# Patient Record
Sex: Female | Born: 1985 | Hispanic: Refuse to answer | Marital: Married | Smoking: Never smoker
Health system: Southern US, Community
[De-identification: ages and names within clinical notes are randomized; demographics above are authoritative.]

## PROBLEM LIST (undated history)

## (undated) DIAGNOSIS — G43909 Migraine, unspecified, not intractable, without status migrainosus: Secondary | ICD-10-CM

## (undated) DIAGNOSIS — M6208 Separation of muscle (nontraumatic), other site: Secondary | ICD-10-CM

## (undated) DIAGNOSIS — D509 Iron deficiency anemia, unspecified: Secondary | ICD-10-CM

## (undated) HISTORY — DX: Migraine, unspecified, not intractable, without status migrainosus: G43.909

## (undated) HISTORY — PX: WISDOM TOOTH EXTRACTION: SHX21

## (undated) HISTORY — PX: TONSILLECTOMY: SHX5217

---

## 2014-06-13 DIAGNOSIS — IMO0002 Reserved for concepts with insufficient information to code with codable children: Secondary | ICD-10-CM

## 2015-07-26 ENCOUNTER — Other Ambulatory Visit (INDEPENDENT_AMBULATORY_CARE_PROVIDER_SITE_OTHER): Payer: BLUE CROSS/BLUE SHIELD

## 2015-07-26 ENCOUNTER — Encounter: Payer: Self-pay | Admitting: Obstetrics & Gynecology

## 2015-07-26 DIAGNOSIS — N912 Amenorrhea, unspecified: Secondary | ICD-10-CM | POA: Diagnosis not present

## 2015-07-26 DIAGNOSIS — Z3201 Encounter for pregnancy test, result positive: Secondary | ICD-10-CM

## 2015-07-26 LAB — POCT PREGNANCY, URINE: PREG TEST UR: POSITIVE — AB

## 2015-07-26 NOTE — Progress Notes (Signed)
Patient had positive pregnancy test today in office. First day of last menstrual period was July 5th, 2016, which makes her [redacted] weeks pregnant. Patient will be going to [facility] for prenatal care.

## 2015-08-06 ENCOUNTER — Encounter: Payer: Self-pay | Admitting: Advanced Practice Midwife

## 2015-08-06 ENCOUNTER — Ambulatory Visit (INDEPENDENT_AMBULATORY_CARE_PROVIDER_SITE_OTHER): Payer: BLUE CROSS/BLUE SHIELD | Admitting: Advanced Practice Midwife

## 2015-08-06 ENCOUNTER — Other Ambulatory Visit: Payer: Self-pay | Admitting: Advanced Practice Midwife

## 2015-08-06 VITALS — BP 105/63 | HR 72 | Ht 62.5 in | Wt 149.0 lb

## 2015-08-06 DIAGNOSIS — Z3401 Encounter for supervision of normal first pregnancy, first trimester: Secondary | ICD-10-CM | POA: Diagnosis not present

## 2015-08-06 DIAGNOSIS — Z3490 Encounter for supervision of normal pregnancy, unspecified, unspecified trimester: Secondary | ICD-10-CM | POA: Insufficient documentation

## 2015-08-06 DIAGNOSIS — Z3491 Encounter for supervision of normal pregnancy, unspecified, first trimester: Secondary | ICD-10-CM

## 2015-08-06 NOTE — Patient Instructions (Signed)
First Trimester of Pregnancy The first trimester of pregnancy is from week 1 until the end of week 12 (months 1 through 3). A week after a sperm fertilizes an egg, the egg will implant on the wall of the uterus. This embryo will begin to develop into a baby. Genes from you and your partner are forming the baby. The female genes determine whether the baby is a boy or a girl. At 6-8 weeks, the eyes and face are formed, and the heartbeat can be seen on ultrasound. At the end of 12 weeks, all the baby's organs are formed.  Now that you are pregnant, you will want to do everything you can to have a healthy baby. Two of the most important things are to get good prenatal care and to follow your health care provider's instructions. Prenatal care is all the medical care you receive before the baby's birth. This care will help prevent, find, and treat any problems during the pregnancy and childbirth. BODY CHANGES Your body goes through many changes during pregnancy. The changes vary from woman to woman.   You may gain or lose a couple of pounds at first.  You may feel sick to your stomach (nauseous) and throw up (vomit). If the vomiting is uncontrollable, call your health care provider.  You may tire easily.  You may develop headaches that can be relieved by medicines approved by your health care provider.  You may urinate more often. Painful urination may mean you have a bladder infection.  You may develop heartburn as a result of your pregnancy.  You may develop constipation because certain hormones are causing the muscles that push waste through your intestines to slow down.  You may develop hemorrhoids or swollen, bulging veins (varicose veins).  Your breasts may begin to grow larger and become tender. Your nipples may stick out more, and the tissue that surrounds them (areola) may become darker.  Your gums may bleed and may be sensitive to brushing and flossing.  Dark spots or blotches (chloasma,  mask of pregnancy) may develop on your face. This will likely fade after the baby is born.  Your menstrual periods will stop.  You may have a loss of appetite.  You may develop cravings for certain kinds of food.  You may have changes in your emotions from day to day, such as being excited to be pregnant or being concerned that something may go wrong with the pregnancy and baby.  You may have more vivid and strange dreams.  You may have changes in your hair. These can include thickening of your hair, rapid growth, and changes in texture. Some women also have hair loss during or after pregnancy, or hair that feels dry or thin. Your hair will most likely return to normal after your baby is born. WHAT TO EXPECT AT YOUR PRENATAL VISITS During a routine prenatal visit:  You will be weighed to make sure you and the baby are growing normally.  Your blood pressure will be taken.  Your abdomen will be measured to track your baby's growth.  The fetal heartbeat will be listened to starting around week 10 or 12 of your pregnancy.  Test results from any previous visits will be discussed. Your health care provider may ask you:  How you are feeling.  If you are feeling the baby move.  If you have had any abnormal symptoms, such as leaking fluid, bleeding, severe headaches, or abdominal cramping.  If you have any questions. Other tests   that may be performed during your first trimester include:  Blood tests to find your blood type and to check for the presence of any previous infections. They will also be used to check for low iron levels (anemia) and Rh antibodies. Later in the pregnancy, blood tests for diabetes will be done along with other tests if problems develop.  Urine tests to check for infections, diabetes, or protein in the urine.  An ultrasound to confirm the proper growth and development of the baby.  An amniocentesis to check for possible genetic problems.  Fetal screens for  spina bifida and Down syndrome.  You may need other tests to make sure you and the baby are doing well. HOME CARE INSTRUCTIONS  Medicines  Follow your health care provider's instructions regarding medicine use. Specific medicines may be either safe or unsafe to take during pregnancy.  Take your prenatal vitamins as directed.  If you develop constipation, try taking a stool softener if your health care provider approves. Diet  Eat regular, well-balanced meals. Choose a variety of foods, such as meat or vegetable-based protein, fish, milk and low-fat dairy products, vegetables, fruits, and whole grain breads and cereals. Your health care provider will help you determine the amount of weight gain that is right for you.  Avoid raw meat and uncooked cheese. These carry germs that can cause birth defects in the baby.  Eating four or five small meals rather than three large meals a day may help relieve nausea and vomiting. If you start to feel nauseous, eating a few soda crackers can be helpful. Drinking liquids between meals instead of during meals also seems to help nausea and vomiting.  If you develop constipation, eat more high-fiber foods, such as fresh vegetables or fruit and whole grains. Drink enough fluids to keep your urine clear or pale yellow. Activity and Exercise  Exercise only as directed by your health care provider. Exercising will help you:  Control your weight.  Stay in shape.  Be prepared for labor and delivery.  Experiencing pain or cramping in the lower abdomen or low back is a good sign that you should stop exercising. Check with your health care provider before continuing normal exercises.  Try to avoid standing for long periods of time. Move your legs often if you must stand in one place for a long time.  Avoid heavy lifting.  Wear low-heeled shoes, and practice good posture.  You may continue to have sex unless your health care provider directs you  otherwise. Relief of Pain or Discomfort  Wear a good support bra for breast tenderness.   Take warm sitz baths to soothe any pain or discomfort caused by hemorrhoids. Use hemorrhoid cream if your health care provider approves.   Rest with your legs elevated if you have leg cramps or low back pain.  If you develop varicose veins in your legs, wear support hose. Elevate your feet for 15 minutes, 3-4 times a day. Limit salt in your diet. Prenatal Care  Schedule your prenatal visits by the twelfth week of pregnancy. They are usually scheduled monthly at first, then more often in the last 2 months before delivery.  Write down your questions. Take them to your prenatal visits.  Keep all your prenatal visits as directed by your health care provider. Safety  Wear your seat belt at all times when driving.  Make a list of emergency phone numbers, including numbers for family, friends, the hospital, and police and fire departments. General Tips    Ask your health care provider for a referral to a local prenatal education class. Begin classes no later than at the beginning of month 6 of your pregnancy.  Ask for help if you have counseling or nutritional needs during pregnancy. Your health care provider can offer advice or refer you to specialists for help with various needs.  Do not use hot tubs, steam rooms, or saunas.  Do not douche or use tampons or scented sanitary pads.  Do not cross your legs for long periods of time.  Avoid cat litter boxes and soil used by cats. These carry germs that can cause birth defects in the baby and possibly loss of the fetus by miscarriage or stillbirth.  Avoid all smoking, herbs, alcohol, and medicines not prescribed by your health care provider. Chemicals in these affect the formation and growth of the baby.  Schedule a dentist appointment. At home, brush your teeth with a soft toothbrush and be gentle when you floss. SEEK MEDICAL CARE IF:   You have  dizziness.  You have mild pelvic cramps, pelvic pressure, or nagging pain in the abdominal area.  You have persistent nausea, vomiting, or diarrhea.  You have a bad smelling vaginal discharge.  You have pain with urination.  You notice increased swelling in your face, hands, legs, or ankles. SEEK IMMEDIATE MEDICAL CARE IF:   You have a fever.  You are leaking fluid from your vagina.  You have spotting or bleeding from your vagina.  You have severe abdominal cramping or pain.  You have rapid weight gain or loss.  You vomit blood or material that looks like coffee grounds.  You are exposed to German measles and have never had them.  You are exposed to fifth disease or chickenpox.  You develop a severe headache.  You have shortness of breath.  You have any kind of trauma, such as from a fall or a car accident. Document Released: 11/18/2001 Document Revised: 04/10/2014 Document Reviewed: 10/04/2013 ExitCare Patient Information 2015 [employer]. This information is not intended to replace advice given to you by your health care provider. Make sure you discuss any questions you have with your health care provider.  

## 2015-08-06 NOTE — Progress Notes (Signed)
Pt here for initial prenatal visit. Last Pap 2015 - normal at [facility] - Previous pregnancy at [facility] and all records/info are on [facility].  Bedside US shows single IUP with FHR 137 and CRL [redacted]w[redacted]d which is only 4 days off from dating by LMP ([redacted]w[redacted]d)

## 2015-08-06 NOTE — Progress Notes (Signed)
   Subjective:    Kathy Nichols is a G2P1001 [redacted]w[redacted]d being seen today for her first obstetrical visit.  Her obstetrical history is significant for NSVD x1 at term. Patient does intend to breast feed.  She is currently breastfeeding her 39 month old. Pregnancy history fully reviewed.  Patient reports fatigue and nausea.  She does not desire medication at this time and is managing well.   Filed Vitals:   08/06/15 1006 08/06/15 1007  BP: 105/63   Pulse: 72   Height:  5' 2.5" (1.588 m)  Weight: 149 lb (67.586 kg)     HISTORY: OB History  Gravida Para Term Preterm AB SAB TAB Ectopic Multiple Living  # Outcome Date GA Lbr Len/2nd Weight Sex Delivery Anes PTL Lv  2 Current           1 Term 06/13/14 [redacted]w[redacted]d  5 lb 12 oz (2.608 kg) M Vag-Spont EPI N Y     Complications: Laceration     Past Medical History  Diagnosis Date  . Migraine    Past Surgical History  Procedure Laterality Date  . Tonsillectomy    . Wisdom tooth extraction     Family History  Problem Relation Age of Onset  . Diabetes Maternal Grandmother   . Diabetes Paternal Grandmother   . Thyroid disease Mother   . Thyroid disease Brother   . Hypertension Father   . Glaucoma Paternal Grandfather   . Hyperlipidemia Father      Exam    Uterus:     Pelvic Exam: Exam deferred, Pap in 2015 was normal  System: Breast:  normal appearance, no masses or tenderness   Skin: normal coloration and turgor, no rashes    Neurologic: oriented, normal, gait normal; reflexes normal and symmetric   Extremities: normal strength, tone, and muscle mass, ROM of all joints is normal   HEENT neck supple with midline trachea   Mouth/Teeth mucous membranes moist, pharynx normal without lesions and dental hygiene good   Neck supple and no masses   Cardiovascular:    Respiratory:  appears well, vitals normal, no respiratory distress, acyanotic, normal RR, ear and throat exam is normal, neck free of mass or lymphadenopathy   Abdomen: soft, non-tender; bowel sounds normal; no masses,  no organomegaly   Urinary: not evaluated      Assessment:    Pregnancy: G2P1001 Patient Active Problem List   Diagnosis Date Noted  . Normal pregnancy 08/06/2015        Plan:     Initial labs drawn. Prenatal vitamins. Problem list reviewed and updated. Genetic Screening discussed First Screen: ordered.  Ultrasound discussed; fetal survey: requested.  Follow up according to [facility] schedule    LEFTWICH-KIRBY, Kathy Nichols 08/06/2015

## 2015-08-07 LAB — PRENATAL PROFILE (SOLSTAS)
Antibody Screen: NEGATIVE
BASOS PCT: 0 % (ref 0–1)
Basophils Absolute: 0 10*3/uL (ref 0.0–0.1)
Eosinophils Absolute: 0 10*3/uL (ref 0.0–0.7)
Eosinophils Relative: 0 % (ref 0–5)
HCT: 40.7 % (ref 36.0–46.0)
HEMOGLOBIN: 12.7 g/dL (ref 12.0–15.0)
HEP B S AG: NEGATIVE
HIV: NONREACTIVE
LYMPHS ABS: 1.4 10*3/uL (ref 0.7–4.0)
LYMPHS PCT: 20 % (ref 12–46)
MCH: 25.5 pg — AB (ref 26.0–34.0)
MCHC: 31.2 g/dL (ref 30.0–36.0)
MCV: 81.6 fL (ref 78.0–100.0)
MONO ABS: 0.5 10*3/uL (ref 0.1–1.0)
MONOS PCT: 7 % (ref 3–12)
MPV: 10 fL (ref 8.6–12.4)
NEUTROS ABS: 5 10*3/uL (ref 1.7–7.7)
NEUTROS PCT: 73 % (ref 43–77)
Platelets: 227 10*3/uL (ref 150–400)
RBC: 4.99 MIL/uL (ref 3.87–5.11)
RDW: 15.9 % — AB (ref 11.5–15.5)
RUBELLA: 4.01 {index} — AB (ref <0.90)
Rh Type: POSITIVE
WBC: 6.9 10*3/uL (ref 4.0–10.5)

## 2015-08-07 LAB — GC/CHLAMYDIA PROBE AMP
CT PROBE, AMP APTIMA: NEGATIVE
GC PROBE AMP APTIMA: NEGATIVE

## 2015-08-09 LAB — CULTURE, OB URINE: Colony Count: 1000

## 2015-08-10 ENCOUNTER — Encounter: Payer: Self-pay | Admitting: Advanced Practice Midwife

## 2015-08-10 DIAGNOSIS — O2340 Unspecified infection of urinary tract in pregnancy, unspecified trimester: Secondary | ICD-10-CM

## 2015-08-10 DIAGNOSIS — B951 Streptococcus, group B, as the cause of diseases classified elsewhere: Secondary | ICD-10-CM | POA: Insufficient documentation

## 2015-09-04 ENCOUNTER — Encounter: Payer: BLUE CROSS/BLUE SHIELD | Admitting: Obstetrics & Gynecology

## 2015-09-05 ENCOUNTER — Other Ambulatory Visit: Payer: Self-pay | Admitting: Advanced Practice Midwife

## 2015-09-05 DIAGNOSIS — Z369 Encounter for antenatal screening, unspecified: Secondary | ICD-10-CM

## 2015-09-05 DIAGNOSIS — Z3A12 12 weeks gestation of pregnancy: Secondary | ICD-10-CM

## 2015-09-05 DIAGNOSIS — Z3491 Encounter for supervision of normal pregnancy, unspecified, first trimester: Secondary | ICD-10-CM

## 2015-09-06 ENCOUNTER — Encounter (HOSPITAL_COMMUNITY): Payer: Self-pay

## 2015-09-06 ENCOUNTER — Ambulatory Visit (HOSPITAL_COMMUNITY)
Admission: RE | Admit: 2015-09-06 | Discharge: 2015-09-06 | Disposition: A | Discharge status: Home / Self Care | Payer: BLUE CROSS/BLUE SHIELD | Source: Ambulatory Visit | Attending: Advanced Practice Midwife | Admitting: Advanced Practice Midwife

## 2015-09-06 ENCOUNTER — Other Ambulatory Visit: Payer: Self-pay | Admitting: Advanced Practice Midwife

## 2015-09-06 ENCOUNTER — Telehealth: Payer: Self-pay | Admitting: *Deleted

## 2015-09-06 DIAGNOSIS — Z369 Encounter for antenatal screening, unspecified: Secondary | ICD-10-CM

## 2015-09-06 DIAGNOSIS — Z3A12 12 weeks gestation of pregnancy: Secondary | ICD-10-CM

## 2015-09-06 DIAGNOSIS — Z36 Encounter for antenatal screening of mother: Secondary | ICD-10-CM | POA: Insufficient documentation

## 2015-09-06 DIAGNOSIS — Z3491 Encounter for supervision of normal pregnancy, unspecified, first trimester: Secondary | ICD-10-CM

## 2015-09-06 NOTE — Telephone Encounter (Signed)
Called pt to follow up on 12w OB visit - [facility] schedule. LMOM for pt to rtn call.

## 2015-09-07 ENCOUNTER — Ambulatory Visit (INDEPENDENT_AMBULATORY_CARE_PROVIDER_SITE_OTHER): Payer: BLUE CROSS/BLUE SHIELD | Admitting: Family

## 2015-09-07 ENCOUNTER — Encounter: Payer: BLUE CROSS/BLUE SHIELD | Admitting: Family

## 2015-09-07 VITALS — BP 83/52 | HR 74 | Wt 144.0 lb

## 2015-09-07 DIAGNOSIS — Z3490 Encounter for supervision of normal pregnancy, unspecified, unspecified trimester: Secondary | ICD-10-CM

## 2015-09-07 DIAGNOSIS — Z3481 Encounter for supervision of other normal pregnancy, first trimester: Secondary | ICD-10-CM

## 2015-09-07 NOTE — Progress Notes (Signed)
Subjective:  Kathy Nichols is a 29 y.o. G2P1001 at [redacted]w[redacted]d being seen today for ongoing prenatal care.  Patient reports fatigue.  Contractions: Not present.  Vag. Bleeding: None. Movement: Absent.   The following portions of the patient's history were reviewed and updated as appropriate: allergies, current medications, past family history, past medical history, past social history, past surgical history and problem list.   Objective:   Filed Vitals:   09/07/15 0958  BP: 83/52  Pulse: 74  Weight: 144 lb (65.318 kg)    Fetal Status:     Movement: Absent     General:  Alert, oriented and cooperative. Patient is in no acute distress.  Skin: Skin is warm and dry. No rash noted.   Cardiovascular: Normal heart rate noted  Respiratory: Normal respiratory effort, no problems with respiration noted  Abdomen: Soft, gravid, appropriate for gestational age. Pain/Pressure: Present     Pelvic: Vag. Bleeding: None Vag D/C Character: Thin   Cervical exam deferred        Extremities: Normal range of motion.  Edema: None  Mental Status: Normal mood and affect. Normal behavior. Normal judgment and thought content.   Urinalysis:    Protein Negative Glucose negative  Assessment and Plan:  Pregnancy: G2P1001 at [redacted]w[redacted]d  1. Normal pregnancy, unspecified trimester - Explained fatigue in first trimester normal; encouraged rest - Reviewed prenatal labs and NT ultrasound - Diet recommendation > avocado, sweet potato (desire low smelling food) - Anatomy ultrasound ordered for 18-19 weeks ([facility])   General obstetric precautions including but not limited to vaginal bleeding and pelvic pain reviewed in detail with the patient.  Please refer to After Visit Summary for other counseling recommendations.   Follow-up at 20 wks  Marlis Edelson, CNM

## 2015-09-11 ENCOUNTER — Encounter: Payer: Self-pay | Admitting: *Deleted

## 2015-09-11 ENCOUNTER — Other Ambulatory Visit (HOSPITAL_COMMUNITY): Payer: Self-pay | Admitting: Advanced Practice Midwife

## 2015-09-11 DIAGNOSIS — Z3490 Encounter for supervision of normal pregnancy, unspecified, unspecified trimester: Secondary | ICD-10-CM

## 2015-10-26 ENCOUNTER — Ambulatory Visit (HOSPITAL_COMMUNITY)
Admission: RE | Admit: 2015-10-26 | Discharge: 2015-10-26 | Disposition: A | Discharge status: Home / Self Care | Payer: BLUE CROSS/BLUE SHIELD | Source: Ambulatory Visit | Attending: Family | Admitting: Family

## 2015-10-26 ENCOUNTER — Other Ambulatory Visit: Payer: Self-pay | Admitting: Family

## 2015-10-26 DIAGNOSIS — Z3490 Encounter for supervision of normal pregnancy, unspecified, unspecified trimester: Secondary | ICD-10-CM

## 2015-10-26 DIAGNOSIS — Z3689 Encounter for other specified antenatal screening: Secondary | ICD-10-CM

## 2015-10-26 DIAGNOSIS — Z3A19 19 weeks gestation of pregnancy: Secondary | ICD-10-CM

## 2015-10-26 DIAGNOSIS — Z36 Encounter for antenatal screening of mother: Secondary | ICD-10-CM | POA: Insufficient documentation

## 2015-11-05 ENCOUNTER — Ambulatory Visit (INDEPENDENT_AMBULATORY_CARE_PROVIDER_SITE_OTHER): Payer: BLUE CROSS/BLUE SHIELD | Admitting: Advanced Practice Midwife

## 2015-11-05 VITALS — BP 103/61 | HR 81 | Wt 156.0 lb

## 2015-11-05 DIAGNOSIS — Z36 Encounter for antenatal screening of mother: Secondary | ICD-10-CM

## 2015-11-05 DIAGNOSIS — Z3492 Encounter for supervision of normal pregnancy, unspecified, second trimester: Secondary | ICD-10-CM

## 2015-11-05 DIAGNOSIS — Z3482 Encounter for supervision of other normal pregnancy, second trimester: Secondary | ICD-10-CM

## 2015-11-05 NOTE — Patient Instructions (Signed)
Tdap Vaccine (Tetanus, Diphtheria and Pertussis): What You Need to Know 1. Why get vaccinated? Tetanus, diphtheria and pertussis are very serious diseases. Tdap vaccine can protect us from these diseases. And, Tdap vaccine given to pregnant women can protect newborn babies against pertussis. TETANUS (Lockjaw) is rare in the [place] today. It causes painful muscle tightening and stiffness, usually all over the body.  It can lead to tightening of muscles in the head and neck so you can't open your mouth, swallow, or sometimes even breathe. Tetanus kills about 1 out of 10 people who are infected even after receiving the best medical care. DIPHTHERIA is also rare in the [place] today. It can cause a thick coating to form in the back of the throat.  It can lead to breathing problems, heart failure, paralysis, and death. PERTUSSIS (Whooping Cough) causes severe coughing spells, which can cause difficulty breathing, vomiting and disturbed sleep.  It can also lead to weight loss, incontinence, and rib fractures. Up to 2 in 100 adolescents and 5 in 100 adults with pertussis are hospitalized or have complications, which could include pneumonia or death. These diseases are caused by bacteria. Diphtheria and pertussis are spread from person to person through secretions from coughing or sneezing. Tetanus enters the body through cuts, scratches, or wounds. Before vaccines, as many as 200,000 cases of diphtheria, 200,000 cases of pertussis, and hundreds of cases of tetanus, were reported in the [place] each year. Since vaccination began, reports of cases for tetanus and diphtheria have dropped by about 99% and for pertussis by about 80%. 2. Tdap vaccine Tdap vaccine can protect adolescents and adults from tetanus, diphtheria, and pertussis. One dose of Tdap is routinely given at age 11 or 12. People who did not get Tdap at that age should get it as soon as possible. Tdap is especially important  for healthcare professionals and anyone having close contact with a baby younger than 12 months. Pregnant women should get a dose of Tdap during every pregnancy, to protect the newborn from pertussis. Infants are most at risk for severe, life-threatening complications from pertussis. Another vaccine, called Td, protects against tetanus and diphtheria, but not pertussis. A Td booster should be given every 10 years. Tdap may be given as one of these boosters if you have never gotten Tdap before. Tdap may also be given after a severe cut or burn to prevent tetanus infection. Your doctor or the person giving you the vaccine can give you more information. Tdap may safely be given at the same time as other vaccines. 3. Some people should not get this vaccine  A person who has ever had a life-threatening allergic reaction after a previous dose of any diphtheria, tetanus or pertussis containing vaccine, OR has a severe allergy to any part of this vaccine, should not get Tdap vaccine. Tell the person giving the vaccine about any severe allergies.  Anyone who had coma or long repeated seizures within 7 days after a childhood dose of DTP or DTaP, or a previous dose of Tdap, should not get Tdap, unless a cause other than the vaccine was found. They can still get Td.  Talk to your doctor if you:  have seizures or another nervous system problem,  had severe pain or swelling after any vaccine containing diphtheria, tetanus or pertussis,  ever had a condition called Guillain-Barr Syndrome (GBS),  aren't feeling well on the day the shot is scheduled. 4. Risks With any medicine, including vaccines, there is   a chance of side effects. These are usually mild and go away on their own. Serious reactions are also possible but are rare. Most people who get Tdap vaccine do not have any problems with it. Mild problems following Tdap (Did not interfere with activities)  Pain where the shot was given (about 3 in 4  adolescents or 2 in 3 adults)  Redness or swelling where the shot was given (about 1 person in 5)  Mild fever of at least 100.4F (up to about 1 in 25 adolescents or 1 in 100 adults)  Headache (about 3 or 4 people in 10)  Tiredness (about 1 person in 3 or 4)  Nausea, vomiting, diarrhea, stomach ache (up to 1 in 4 adolescents or 1 in 10 adults)  Chills, sore joints (about 1 person in 10)  Body aches (about 1 person in 3 or 4)  Rash, swollen glands (uncommon) Moderate problems following Tdap (Interfered with activities, but did not require medical attention)  Pain where the shot was given (up to 1 in 5 or 6)  Redness or swelling where the shot was given (up to about 1 in 16 adolescents or 1 in 12 adults)  Fever over 102F (about 1 in 100 adolescents or 1 in 250 adults)  Headache (about 1 in 7 adolescents or 1 in 10 adults)  Nausea, vomiting, diarrhea, stomach ache (up to 1 or 3 people in 100)  Swelling of the entire arm where the shot was given (up to about 1 in 500). Severe problems following Tdap (Unable to perform usual activities; required medical attention)  Swelling, severe pain, bleeding and redness in the arm where the shot was given (rare). Problems that could happen after any vaccine:  People sometimes faint after a medical procedure, including vaccination. Sitting or lying down for about 15 minutes can help prevent fainting, and injuries caused by a fall. Tell your doctor if you feel dizzy, or have vision changes or ringing in the ears.  Some people get severe pain in the shoulder and have difficulty moving the arm where a shot was given. This happens very rarely.  Any medication can cause a severe allergic reaction. Such reactions from a vaccine are very rare, estimated at fewer than 1 in a million doses, and would happen within a few minutes to a few hours after the vaccination. As with any medicine, there is a very remote chance of a vaccine causing a serious  injury or death. The safety of vaccines is always being monitored. For more information, visit: www.cdc.gov/vaccinesafety/ 5. What if there is a serious problem? What should I look for?  Look for anything that concerns you, such as signs of a severe allergic reaction, very high fever, or unusual behavior.  Signs of a severe allergic reaction can include hives, swelling of the face and throat, difficulty breathing, a fast heartbeat, dizziness, and weakness. These would usually start a few minutes to a few hours after the vaccination. What should I do?  If you think it is a severe allergic reaction or other emergency that can't wait, call 9-1-1 or get the person to the nearest hospital. Otherwise, call your doctor.  Afterward, the reaction should be reported to the Vaccine Adverse Event Reporting System (VAERS). Your doctor might file this report, or you can do it yourself through the VAERS web site at www.vaers.hhs.gov, or by calling 1-800-822-7967. VAERS does not give medical advice.  6. The National Vaccine Injury Compensation Program The National Vaccine Injury Compensation Program (  VICP) is a federal program that was created to compensate people who may have been injured by certain vaccines. Persons who believe they may have been injured by a vaccine can learn about the program and about filing a claim by calling 1-800-338-2382 or visiting the VICP website at www.hrsa.gov/vaccinecompensation. There is a time limit to file a claim for compensation. 7. How can I learn more?  Ask your doctor. He or she can give you the vaccine package insert or suggest other sources of information.  Call your local or state health department.  Contact the Centers for Disease Control and Prevention (CDC):  Call 1-800-232-4636 (1-800-CDC-INFO) or  Visit CDC's website at www.cdc.gov/vaccines CDC Tdap Vaccine VIS (01/31/14)   This information is not intended to replace advice given to you by your health care  provider. Make sure you discuss any questions you have with your health care provider.   Document Released: 05/25/2012 Document Revised: 12/15/2014 Document Reviewed: 03/08/2014 Elsevier Interactive Patient Education 2016 Elsevier Inc.  

## 2015-11-05 NOTE — Progress Notes (Signed)
Subjective:  Kathy Nichols is a 29 y.o. G2P1001 at 3854w6d being seen today for ongoing prenatal care.  She is currently monitored for the following issues for this low-risk pregnancy and has Normal pregnancy and GBS (group B streptococcus) UTI complicating pregnancy on her problem list.   [place]   Patient reports dysuria.  Contractions: Not present. Vag. Bleeding: None.  Movement: Present. Denies leaking of fluid.   The following portions of the patient's history were reviewed and updated as appropriate: allergies, current medications, past family history, past medical history, past social history, past surgical history and problem list. Problem list updated.  Objective:   Filed Vitals:   11/05/15 0940  BP: 103/61  Pulse: 81  Weight: 156 lb (70.761 kg)    Fetal Status: Fetal Heart Rate (bpm): 153   Movement: Present     General:  Alert, oriented and cooperative. Patient is in no acute distress.  Skin: Skin is warm and dry. No rash noted.   Cardiovascular: Normal heart rate noted  Respiratory: Normal respiratory effort, no problems with respiration noted  Abdomen: Soft, gravid, appropriate for gestational age. Pain/Pressure: Present     Pelvic: Vag. Bleeding: None Vag D/C Character: White   Cervical exam deferred        Extremities: Normal range of motion.  Edema: None  Mental Status: Normal mood and affect. Normal behavior. Normal judgment and thought content.   Urinalysis:      Reviewed normal US.   Assessment and Plan:  Pregnancy: G2P1001 at 4954w6d  1. Normal pregnancy, second trimester  - Alpha fetoprotein, maternal - Culture, OB Urine  Preterm labor symptoms and general obstetric precautions including but not limited to vaginal bleeding, contractions, leaking of fluid and fetal movement were reviewed in detail with the patient. Please refer to After Visit Summary for other counseling recommendations.  Travel precautions. Copy of PNR.   Return in about 8 weeks (around 12/31/2015) for Return OB.   Dorathy KinsmanVirginia Siena Poehler, CNM

## 2015-11-07 LAB — CULTURE, OB URINE: Colony Count: >=100000

## 2015-11-08 ENCOUNTER — Other Ambulatory Visit: Payer: Self-pay | Admitting: Advanced Practice Midwife

## 2015-11-08 DIAGNOSIS — O2342 Unspecified infection of urinary tract in pregnancy, second trimester: Principal | ICD-10-CM

## 2015-11-08 DIAGNOSIS — B951 Streptococcus, group B, as the cause of diseases classified elsewhere: Secondary | ICD-10-CM

## 2015-11-08 LAB — ALPHA FETOPROTEIN, MATERNAL
AFP: 50.4 ng/mL
Curr Gest Age: 20.6 wks.days
MOM FOR AFP: 0.79
Open Spina bifida: NEGATIVE

## 2015-11-08 MED ORDER — AMOXICILLIN 500 MG PO CAPS: 500.0000 mg | ORAL_CAPSULE | Freq: Three times a day (TID) | ORAL | Status: DC

## 2015-11-08 NOTE — Progress Notes (Signed)
GBS UTI. Rx Amox.

## 2015-11-09 ENCOUNTER — Telehealth: Payer: Self-pay | Admitting: *Deleted

## 2015-11-09 NOTE — Telephone Encounter (Signed)
-----   Message from [place], [place] sent at 11/08/2015  8:01 PM EST ----- GBS UTI. I will send in Rx.

## 2015-11-09 NOTE — Telephone Encounter (Signed)
Pt notified of GBS in urine and RX was sent to her pharmacy.

## 2015-12-09 NOTE — L&D Delivery Note (Signed)
Patient is 30 y.o. G2P1001 6871w0d admitted in active labor, uncomplicated pregnancy.   Delivery Note At 9:24 PM a viable female was delivered via Vaginal, Spontaneous Delivery (Presentation: Left Occiput Anterior).  APGAR: 8, 9; weight- pending .   Placenta status: Intact, Spontaneous.  Cord: 3 vessels with the following complications: None. Delayed cord clamping.  Cord pH: not collected  Anesthesia: None  Episiotomy: None Lacerations:  2.0 degree Suture Repair: 3.0 Est. Blood Loss (mL):  150  Mom to postpartum.  Baby to Couplet care / Skin to Skin.  Isa RankinKimberly Niles [facility] 03/11/2016, 9:57 PM

## 2015-12-31 ENCOUNTER — Ambulatory Visit (INDEPENDENT_AMBULATORY_CARE_PROVIDER_SITE_OTHER): Payer: BLUE CROSS/BLUE SHIELD | Admitting: Advanced Practice Midwife

## 2015-12-31 ENCOUNTER — Encounter: Payer: Self-pay | Admitting: Advanced Practice Midwife

## 2015-12-31 VITALS — BP 100/59 | HR 81 | Wt 167.0 lb

## 2015-12-31 DIAGNOSIS — Z23 Encounter for immunization: Secondary | ICD-10-CM | POA: Diagnosis not present

## 2015-12-31 DIAGNOSIS — Z3483 Encounter for supervision of other normal pregnancy, third trimester: Secondary | ICD-10-CM

## 2015-12-31 DIAGNOSIS — Z36 Encounter for antenatal screening of mother: Secondary | ICD-10-CM

## 2015-12-31 DIAGNOSIS — Z3493 Encounter for supervision of normal pregnancy, unspecified, third trimester: Secondary | ICD-10-CM

## 2015-12-31 NOTE — Progress Notes (Signed)
Subjective:  Kathy Nichols is a 30 y.o. G2P1001 at [redacted]w[redacted]d being seen today for ongoing prenatal care.  She is currently monitored for the following issues for this low-risk pregnancy and has Normal pregnancy and GBS (group B streptococcus) UTI complicating pregnancy on her problem list.  Patient reports backache and sleepign issues.   Discussed sleep hygiene and blue filter for phone (plays games at bedtime).  Discussed back care   Contractions: Irregular. Vag. Bleeding: None.  Movement: Present. Denies leaking of fluid.   The following portions of the patient's history were reviewed and updated as appropriate: allergies, current medications, past family history, past medical history, past social history, past surgical history and problem list. Problem list updated.  Objective:   Filed Vitals:   12/31/15 0935  BP: 100/59  Pulse: 81  Weight: 75.751 kg (167 lb)    Fetal Status: Fetal Heart Rate (bpm): 143   Movement: Present     General:  Alert, oriented and cooperative. Patient is in no acute distress.  Skin: Skin is warm and dry. No rash noted.   Cardiovascular: Normal heart rate noted  Respiratory: Normal respiratory effort, no problems with respiration noted  Abdomen: Soft, gravid, appropriate for gestational age. Pain/Pressure: Present     Pelvic: Vag. Bleeding: None Vag D/C Character: Thin   Cervical exam deferred        Extremities: Normal range of motion.  Edema: None  Mental Status: Normal mood and affect. Normal behavior. Normal judgment and thought content.   Urinalysis: Urine Protein: Negative Urine Glucose: Negative  Assessment and Plan:  Pregnancy: G2P1001 at [redacted]w[redacted]d  1. Normal pregnancy, third trimester      Reviewed issue and multiple questions answered. Wants female providers if possible and no students if possible. - CBC - HIV antibody - RPR - Glucose Tolerance, 1 HR (50g) - Tdap vaccine greater than or equal to 7yo IM - WET PREP BY MOLECULAR PROBE  Preterm  labor symptoms and general obstetric precautions including but not limited to vaginal bleeding, contractions, leaking of fluid and fetal movement were reviewed in detail with the patient. Please refer to After Visit Summary for other counseling recommendations.  RTO 2 weeks  Aviva Signs, CNM

## 2015-12-31 NOTE — Patient Instructions (Signed)

## 2016-01-01 ENCOUNTER — Telehealth: Payer: Self-pay | Admitting: *Deleted

## 2016-01-01 LAB — CBC
HEMATOCRIT: 33.2 % — AB (ref 36.0–46.0)
Hemoglobin: 10.5 g/dL — ABNORMAL LOW (ref 12.0–15.0)
MCH: 25.4 pg — ABNORMAL LOW (ref 26.0–34.0)
MCHC: 31.6 g/dL (ref 30.0–36.0)
MCV: 80.4 fL (ref 78.0–100.0)
MPV: 10.2 fL (ref 8.6–12.4)
PLATELETS: 191 10*3/uL (ref 150–400)
RBC: 4.13 MIL/uL (ref 3.87–5.11)
RDW: 15.8 % — AB (ref 11.5–15.5)
WBC: 7.4 10*3/uL (ref 4.0–10.5)

## 2016-01-01 LAB — WET PREP BY MOLECULAR PROBE
Candida species: NEGATIVE
GARDNERELLA VAGINALIS: NEGATIVE
Trichomonas vaginosis: NEGATIVE

## 2016-01-01 LAB — HIV ANTIBODY (ROUTINE TESTING W REFLEX): HIV: NONREACTIVE

## 2016-01-01 LAB — GLUCOSE TOLERANCE, 1 HOUR (50G) W/O FASTING: GLUCOSE 1 HOUR GTT: 81 mg/dL (ref 70–140)

## 2016-01-01 LAB — RPR

## 2016-01-01 NOTE — Telephone Encounter (Signed)
Pt notified of normal 1 hr GTT. 

## 2016-01-28 ENCOUNTER — Ambulatory Visit (INDEPENDENT_AMBULATORY_CARE_PROVIDER_SITE_OTHER): Payer: BLUE CROSS/BLUE SHIELD | Admitting: Advanced Practice Midwife

## 2016-01-28 VITALS — BP 110/64 | HR 81 | Wt 173.0 lb

## 2016-01-28 DIAGNOSIS — N898 Other specified noninflammatory disorders of vagina: Secondary | ICD-10-CM | POA: Diagnosis not present

## 2016-01-28 DIAGNOSIS — Z3483 Encounter for supervision of other normal pregnancy, third trimester: Secondary | ICD-10-CM

## 2016-01-28 DIAGNOSIS — Z3493 Encounter for supervision of normal pregnancy, unspecified, third trimester: Secondary | ICD-10-CM

## 2016-01-28 DIAGNOSIS — O26893 Other specified pregnancy related conditions, third trimester: Secondary | ICD-10-CM

## 2016-01-28 NOTE — Patient Instructions (Signed)

## 2016-01-28 NOTE — Progress Notes (Signed)
Subjective:  Kathy Nichols is a 30 y.o. G2P1001 at [redacted]w[redacted]d being seen today for ongoing prenatal care.  She is currently monitored for the following issues for this low-risk pregnancy and has Normal pregnancy and GBS (group B streptococcus) UTI complicating pregnancy on her problem list.  Patient reports occasional round ligament and back pain and vaginal discharge x 3 days.  Contractions: Irregular. Vag. Bleeding: None.  Movement: Present. Denies leaking of fluid.   The following portions of the patient's history were reviewed and updated as appropriate: allergies, current medications, past family history, past medical history, past social history, past surgical history and problem list. Problem list updated.  Objective:   Filed Vitals:   01/28/16 0936  BP: 110/64  Pulse: 81  Weight: 173 lb (78.472 kg)    Fetal Status:     Movement: Present  Presentation: Vertex  General:  Alert, oriented and cooperative. Patient is in no acute distress.  Skin: Skin is warm and dry. No rash noted.   Cardiovascular: Normal heart rate noted  Respiratory: Normal respiratory effort, no problems with respiration noted  Abdomen: Soft, gravid, appropriate for gestational age. Pain/Pressure: Present     Pelvic: Vag. Bleeding: None Vag D/C Character: Thin   Cervical exam performed Dilation: 1 Effacement (%): 50 Station: -3  Extremities: Normal range of motion.  Edema: Trace  Mental Status: Normal mood and affect. Normal behavior. Normal judgment and thought content.   Urinalysis: Urine Protein: Negative (Simultaneous filing. User may not have seen previous data.) Urine Glucose: Negative (Simultaneous filing. User may not have seen previous data.)  Assessment and Plan:  Pregnancy: G2P1001 at [redacted]w[redacted]d  1. Vaginal discharge during pregnancy in third trimester  - WET PREP BY MOLECULAR PROBE  2. Normal pregnancy, third trimester --Pt denies regular contractions, only reports back pain and inguinal pain with  movement.  Reviewed reasons to return to office or go to MAU.  Preterm labor symptoms and general obstetric precautions including but not limited to vaginal bleeding, contractions, leaking of fluid and fetal movement were reviewed in detail with the patient. Please refer to After Visit Summary for other counseling recommendations.  No Follow-up on file.   Hurshel Party, CNM

## 2016-01-28 NOTE — Progress Notes (Signed)
C/O lower back pain, mood swings, increased vaginal discharge. Pt would like exam to check discharge

## 2016-01-29 ENCOUNTER — Telehealth: Payer: Self-pay | Admitting: *Deleted

## 2016-01-29 DIAGNOSIS — N760 Acute vaginitis: Principal | ICD-10-CM

## 2016-01-29 DIAGNOSIS — B9689 Other specified bacterial agents as the cause of diseases classified elsewhere: Secondary | ICD-10-CM

## 2016-01-29 LAB — WET PREP BY MOLECULAR PROBE
Candida species: NEGATIVE
GARDNERELLA VAGINALIS: POSITIVE — AB
TRICHOMONAS VAG: NEGATIVE

## 2016-01-29 MED ORDER — METRONIDAZOLE 0.75 % VA GEL: VAGINAL | Status: DC

## 2016-01-29 NOTE — Telephone Encounter (Signed)
Pt notified of positive BV on wet prep.  She prefers Metrogel over Flagyl PO.  RX was sent to [facility] per L Leftwich-Kirby, CNM

## 2016-01-29 NOTE — Telephone Encounter (Signed)
-----   Message from Lisa A Leftwich-Kirby, CNM sent at 01/29/2016  8:19 AM EST ----- Pt positive for BV.  Call to offer her Flagyl 500 mg BID x 7 days or Metrogel Q HS x 5, her preference.  Thank you. 

## 2016-01-29 NOTE — Telephone Encounter (Signed)
-----   Message from Hurshel Party, CNM sent at 01/29/2016  8:19 AM EST ----- Pt positive for BV.  Call to offer her Flagyl 500 mg BID x 7 days or Metrogel Q HS x 5, her preference.  Thank you.

## 2016-02-25 ENCOUNTER — Ambulatory Visit (INDEPENDENT_AMBULATORY_CARE_PROVIDER_SITE_OTHER): Payer: BLUE CROSS/BLUE SHIELD | Admitting: Advanced Practice Midwife

## 2016-02-25 VITALS — BP 110/67 | HR 80 | Wt 181.0 lb

## 2016-02-25 DIAGNOSIS — R42 Dizziness and giddiness: Secondary | ICD-10-CM | POA: Diagnosis not present

## 2016-02-25 DIAGNOSIS — Z3483 Encounter for supervision of other normal pregnancy, third trimester: Secondary | ICD-10-CM

## 2016-02-25 DIAGNOSIS — Z36 Encounter for antenatal screening of mother: Secondary | ICD-10-CM | POA: Diagnosis not present

## 2016-02-25 DIAGNOSIS — Z3493 Encounter for supervision of normal pregnancy, unspecified, third trimester: Secondary | ICD-10-CM

## 2016-02-25 LAB — OB RESULTS CONSOLE GC/CHLAMYDIA
CHLAMYDIA, DNA PROBE: NEGATIVE
GC PROBE AMP, GENITAL: NEGATIVE

## 2016-02-25 NOTE — Patient Instructions (Signed)
Braxton Hicks Contractions °Contractions of the uterus can occur throughout pregnancy. Contractions are not always a sign that you are in labor.  °WHAT ARE BRAXTON HICKS CONTRACTIONS?  °Contractions that occur before labor are called Braxton Hicks contractions, or false labor. Toward the end of pregnancy (32-34 weeks), these contractions can develop more often and may become more forceful. This is not true labor because these contractions do not result in opening (dilatation) and thinning of the cervix. They are sometimes difficult to tell apart from true labor because these contractions can be forceful and people have different pain tolerances. You should not feel embarrassed if you go to the hospital with false labor. Sometimes, the only way to tell if you are in true labor is for your health care provider to look for changes in the cervix. °If there are no prenatal problems or other health problems associated with the pregnancy, it is completely safe to be sent home with false labor and await the onset of true labor. °HOW CAN YOU TELL THE DIFFERENCE BETWEEN TRUE AND FALSE LABOR? °False Labor °· The contractions of false labor are usually shorter and not as hard as those of true labor.   °· The contractions are usually irregular.   °· The contractions are often felt in the front of the lower abdomen and in the groin.   °· The contractions may go away when you walk around or change positions while lying down.   °· The contractions get weaker and are shorter lasting as time goes on.   °· The contractions do not usually become progressively stronger, regular, and closer together as with true labor.   °True Labor °· Contractions in true labor last 30-70 seconds, become very regular, usually become more intense, and increase in frequency.   °· The contractions do not go away with walking.   °· The discomfort is usually felt in the top of the uterus and spreads to the lower abdomen and low back.   °· True labor can be  determined by your health care provider with an exam. This will show that the cervix is dilating and getting thinner.   °WHAT TO REMEMBER °· Keep up with your usual exercises and follow other instructions given by your health care provider.   °· Take medicines as directed by your health care provider.   °· Keep your regular prenatal appointments.   °· Eat and drink lightly if you think you are going into labor.   °· If Braxton Hicks contractions are making you uncomfortable:   °¨ Change your position from lying down or resting to walking, or from walking to resting.   °¨ Sit and rest in a tub of warm water.   °¨ Drink 2-3 glasses of water. Dehydration may cause these contractions.   °¨ Do slow and deep breathing several times an hour.   °WHEN SHOULD I SEEK IMMEDIATE MEDICAL CARE? °Seek immediate medical care if: °· Your contractions become stronger, more regular, and closer together.   °· You have fluid leaking or gushing from your vagina.   °· You have a fever.   °· You pass blood-tinged mucus.   °· You have vaginal bleeding.   °· You have continuous abdominal pain.   °· You have low back pain that you never had before.   °· You feel your baby's head pushing down and causing pelvic pressure.   °· Your baby is not moving as much as it used to.   °  °This information is not intended to replace advice given to you by your health care provider. Make sure you discuss any questions you have with your health care   provider. °  °Document Released: 11/24/2005 Document Revised: 11/29/2013 Document Reviewed: 09/05/2013 °Elsevier Interactive Patient Education ©2016 [employer] ° °

## 2016-02-25 NOTE — Progress Notes (Signed)
Subjective:  Kathy Nichols is a 30 y.o. G2P1001 at 3840w6d being seen today for ongoing prenatal care.  She is currently monitored for the following issues for this low-risk pregnancy and has Normal pregnancy and GBS (group B streptococcus) UTI complicating pregnancy on her problem list.  Patient reports occasional contractions and occassional dizziness and seeing stars upon standing last few months. No HA, blurry vision, epiagstric pain, syncope, difficulties w/ speach or gait..  Contractions: Irregular. Vag. Bleeding: None.  Movement: Present. Denies leaking of fluid.   The following portions of the patient's history were reviewed and updated as appropriate: allergies, current medications, past family history, past medical history, past social history, past surgical history and problem list. Problem list updated.  Objective:   Filed Vitals:   02/25/16 0926  BP: 110/67  Pulse: 80  Weight: 181 lb (82.101 kg)    Fetal Status: Fetal Heart Rate (bpm): 130 Fundal Height: 37 cm Movement: Present  Presentation: Vertex  General:  Alert, oriented and cooperative. Patient is in no acute distress.  Skin: Skin is warm and dry. No rash noted.   Cardiovascular: Normal heart rate noted  Respiratory: Normal respiratory effort, no problems with respiration noted  Abdomen: Soft, gravid, appropriate for gestational age. Pain/Pressure: Present     Pelvic: Vag. Bleeding: None Vag D/C Character: Thin   Cervical exam performed Dilation: 4 Effacement (%): 50 Station: -2  Extremities: Normal range of motion.  Edema: None  Mental Status: Normal mood and affect. Normal behavior. Normal judgment and thought content.   Urinalysis:      Assessment and Plan:  Pregnancy: G2P1001 at 1340w6d  1. Normal pregnancy, third trimester  - GC/Chlamydia Probe Amp  2. Dizziness  - CBC  Term labor symptoms and general obstetric precautions including but not limited to vaginal bleeding, contractions, leaking of fluid and  fetal movement were reviewed in detail with the patient. Please refer to After Visit Summary for other counseling recommendations.  Return in about 2 weeks (around 03/10/2016).   Dorathy KinsmanVirginia Foday [facility]one, CNM

## 2016-02-26 LAB — CBC
HCT: 34.1 % — ABNORMAL LOW (ref 36.0–46.0)
Hemoglobin: 10.9 g/dL — ABNORMAL LOW (ref 12.0–15.0)
MCH: 25.2 pg — ABNORMAL LOW (ref 26.0–34.0)
MCHC: 32 g/dL (ref 30.0–36.0)
MCV: 78.9 fL (ref 78.0–100.0)
MPV: 9.7 fL (ref 8.6–12.4)
PLATELETS: 189 10*3/uL (ref 150–400)
RBC: 4.32 MIL/uL (ref 3.87–5.11)
RDW: 16.4 % — AB (ref 11.5–15.5)
WBC: 7.6 10*3/uL (ref 4.0–10.5)

## 2016-02-26 LAB — GC/CHLAMYDIA PROBE AMP
CT PROBE, AMP APTIMA: NOT DETECTED
GC PROBE AMP APTIMA: NOT DETECTED

## 2016-02-29 ENCOUNTER — Inpatient Hospital Stay (HOSPITAL_COMMUNITY)
Admission: AD | Admit: 2016-02-29 | Discharge: 2016-02-29 | Disposition: A | Discharge status: Home / Self Care | Payer: BLUE CROSS/BLUE SHIELD | Source: Ambulatory Visit | Attending: Obstetrics & Gynecology | Admitting: Obstetrics & Gynecology

## 2016-02-29 ENCOUNTER — Encounter (HOSPITAL_COMMUNITY): Payer: Self-pay | Admitting: Certified Nurse Midwife

## 2016-02-29 DIAGNOSIS — Z3A37 37 weeks gestation of pregnancy: Secondary | ICD-10-CM | POA: Diagnosis not present

## 2016-02-29 DIAGNOSIS — O368130 Decreased fetal movements, third trimester, not applicable or unspecified: Secondary | ICD-10-CM | POA: Insufficient documentation

## 2016-02-29 DIAGNOSIS — N898 Other specified noninflammatory disorders of vagina: Secondary | ICD-10-CM

## 2016-02-29 DIAGNOSIS — O26893 Other specified pregnancy related conditions, third trimester: Secondary | ICD-10-CM

## 2016-02-29 LAB — WET PREP, GENITAL
Clue Cells Wet Prep HPF POC: NONE SEEN
Sperm: NONE SEEN
Trich, Wet Prep: NONE SEEN
Yeast Wet Prep HPF POC: NONE SEEN

## 2016-02-29 LAB — URINE MICROSCOPIC-ADD ON

## 2016-02-29 LAB — URINALYSIS, ROUTINE W REFLEX MICROSCOPIC
Bilirubin Urine: NEGATIVE
Glucose, UA: NEGATIVE mg/dL
HGB URINE DIPSTICK: NEGATIVE
Ketones, ur: NEGATIVE mg/dL
NITRITE: NEGATIVE
PROTEIN: NEGATIVE mg/dL
pH: 6.5 (ref 5.0–8.0)

## 2016-02-29 NOTE — MAU Note (Signed)
Pt states that baby is moving less since last night. Pt states she has been leaking thin white fluid for 2 days. No other complaints.

## 2016-02-29 NOTE — Discharge Instructions (Signed)

## 2016-02-29 NOTE — MAU Provider Note (Signed)
History     CSN: [id number]  Arrival date and time: 02/29/16 1635   First Provider Initiated Contact with Patient 02/29/16 1728      Chief Complaint  Patient presents with  . Decreased Fetal Movement   HPI Kathy Nichols 30 y.o. G2P1001 @ [redacted]w[redacted]d presents with the complaint of leaking fluid/vaginal discharge for 2 days and decreased fetal movement today. Denies vaginal bleeding, LOF, contractions.   Past Medical History  Diagnosis Date  . Migraine     Past Surgical History  Procedure Laterality Date  . Tonsillectomy    . Wisdom tooth extraction      Family History  Problem Relation Age of Onset  . Diabetes Maternal Grandmother   . Diabetes Paternal Grandmother   . Thyroid disease Mother   . Thyroid disease Brother   . Hypertension Father   . Glaucoma Paternal Grandfather   . Hyperlipidemia Father     Social History  Substance Use Topics  . Smoking status: Never Smoker   . Smokeless tobacco: None  . Alcohol Use: No    Allergies: No Known Allergies  Prescriptions prior to admission  Medication Sig Dispense Refill Last Dose  . Prenatal Vit-Fe Fumarate-FA (PRENATAL MULTIVITAMIN) TABS tablet Take 1 tablet by mouth daily at 12 noon.   02/29/2016 at Unknown time  . metroNIDAZOLE (METROGEL) 0.75 % vaginal gel Place 1 applicator vaginally at bedtime for 5 nights (Patient not taking: Reported on 02/29/2016) 70 g 0 Completed Course at Unknown time    Review of Systems  Constitutional: Negative for fever.  Genitourinary:       Decreased fetal movement Vaginal discharge  All other systems reviewed and are negative.  Physical Exam   Blood pressure 107/68, pulse 91, resp. rate 97, height  (1.575 m), weight 180 lb (81.647 kg), last menstrual period 06/12/2015, currently breastfeeding.  Physical Exam  Nursing note and vitals reviewed. Constitutional: She is oriented to person, place, and time. She appears well-developed and well-nourished.  HENT:  Head:  Normocephalic and atraumatic.  Cardiovascular: Normal rate.   Respiratory: Effort normal. No respiratory distress.  GI: Soft. There is no tenderness.  Genitourinary: Vaginal discharge found.  yellow thin vaginal discharge  Neurological: She is alert and oriented to person, place, and time.  Skin: Skin is warm and dry.  Psychiatric: She has a normal mood and affect. Her behavior is normal. Judgment and thought content normal.   Results for orders placed or performed during the hospital encounter of 02/29/16 (from the past 24 hour(s))  Urinalysis, Routine w reflex microscopic (not at [facility])     Status: Abnormal   Collection Time: 02/29/16  4:44 PM  Result Value Ref Range   Color, Urine STRAW (A) YELLOW   APPearance CLEAR CLEAR   Specific Gravity, Urine <1.005 (L) 1.005 - 1.030   pH 6.5 5.0 - 8.0   Glucose, UA NEGATIVE NEGATIVE mg/dL   Hgb urine dipstick NEGATIVE NEGATIVE   Bilirubin Urine NEGATIVE NEGATIVE   Ketones, ur NEGATIVE NEGATIVE mg/dL   Protein, ur NEGATIVE NEGATIVE mg/dL   Nitrite NEGATIVE NEGATIVE   Leukocytes, UA SMALL (A) NEGATIVE  Urine microscopic-add on     Status: Abnormal   Collection Time: 02/29/16  4:44 PM  Result Value Ref Range   Squamous Epithelial / LPF 6-30 (A) NONE SEEN   WBC, UA 6-30 0 - 5 WBC/hpf   RBC / HPF 0-5 0 - 5 RBC/hpf   Bacteria, UA FEW (A) NONE SEEN  Wet prep, genital  Status: Abnormal   Collection Time: 02/29/16  5:37 PM  Result Value Ref Range   Yeast Wet Prep HPF POC NONE SEEN NONE SEEN   Trich, Wet Prep NONE SEEN NONE SEEN   Clue Cells Wet Prep HPF POC NONE SEEN NONE SEEN   WBC, Wet Prep HPF POC MODERATE (A) NONE SEEN   Sperm NONE SEEN    Dilation: 4 Effacement (%): 60 Cervical Position: Anterior Station: -2 Presentation: Vertex Exam by:: Dione PloverA. Jones RN  MAU Course  Procedures  MDM FHR-NST reactive  Assessment and Plan  Vaginal discharge  Discharge  Clemmons,Lori Grissett 02/29/2016, 5:55 PM

## 2016-03-03 ENCOUNTER — Ambulatory Visit (INDEPENDENT_AMBULATORY_CARE_PROVIDER_SITE_OTHER): Payer: BLUE CROSS/BLUE SHIELD | Admitting: Advanced Practice Midwife

## 2016-03-03 VITALS — BP 115/76 | HR 96 | Wt 181.0 lb

## 2016-03-03 DIAGNOSIS — B951 Streptococcus, group B, as the cause of diseases classified elsewhere: Secondary | ICD-10-CM

## 2016-03-03 DIAGNOSIS — O2340 Unspecified infection of urinary tract in pregnancy, unspecified trimester: Secondary | ICD-10-CM

## 2016-03-03 DIAGNOSIS — Z3493 Encounter for supervision of normal pregnancy, unspecified, third trimester: Secondary | ICD-10-CM

## 2016-03-03 NOTE — Progress Notes (Signed)
Subjective:  Kathy Nichols is a 30 y.o. G2P1001 at 679w6d being seen today for ongoing prenatal care.  She is currently monitored for the following issues for this low-risk pregnancy and has Normal pregnancy and GBS (group B streptococcus) UTI complicating pregnancy on her problem list.  Patient reports insomnia.  Contractions: Irregular. Vag. Bleeding: None.  Movement: Present. Denies leaking of fluid.   The following portions of the patient's history were reviewed and updated as appropriate: allergies, current medications, past family history, past medical history, past social history, past surgical history and problem list. Problem list updated.  Objective:   Filed Vitals:   03/03/16 1025  BP: 115/76  Pulse: 96  Weight: 181 lb (82.101 kg)    Fetal Status: Fetal Heart Rate (bpm): 158 Fundal Height: 38 cm Movement: Present  Presentation: Vertex  General:  Alert, oriented and cooperative. Patient is in no acute distress.  Skin: Skin is warm and dry. No rash noted.   Cardiovascular: Normal heart rate noted  Respiratory: Normal respiratory effort, no problems with respiration noted  Abdomen: Soft, gravid, appropriate for gestational age. Pain/Pressure: Present     Pelvic: Vag. Bleeding: None Vag D/C Character: Thin   Cervical exam performed Dilation: 4 Effacement (%): 70 Station: -2  Extremities: Normal range of motion.  Edema: Trace  Mental Status: Normal mood and affect. Normal behavior. Normal judgment and thought content.   Urinalysis: Urine Protein: Negative Urine Glucose: Negative  Assessment and Plan:  Pregnancy: G2P1001 at 4579w6d  There are no diagnoses linked to this encounter. Term labor symptoms and general obstetric precautions including but not limited to vaginal bleeding, contractions, leaking of fluid and fetal movement were reviewed in detail with the patient. Please refer to After Visit Summary for other counseling recommendations.  Return in about 1 week (around  03/10/2016).   Dorathy KinsmanVirginia Katria Nichols, CNM

## 2016-03-03 NOTE — Patient Instructions (Signed)
Breastfeeding Challenges and Solutions Even though breastfeeding is natural, it can be challenging, especially in the first few weeks after childbirth. It is normal for problems to arise when starting to breastfeed your new baby, even if you have breastfed before. This document provides some solutions to the most common breastfeeding challenges.  CHALLENGES AND SOLUTIONS Challenge--Cracked or Sore Nipples Cracked or sore nipples are commonly experienced by breastfeeding mothers. Cracked or sore nipples often are caused by inadequate latching (when your baby's mouth attaches to your breast to breastfeed). Soreness can also happen if your baby is not positioned properly at your breast. Although nipple cracking and soreness are common during the first week after birth, nipple pain is never normal. If you experience nipple cracking or soreness that lasts longer than 1 week or nipple pain, call your health care provider or lactation consultant.  Solution Ensure proper latching and positioning of your baby by following the steps below:  Find a comfortable place to sit or lie down, with your neck and back well supported.  Place a pillow or rolled up blanket under your baby to bring him or her to the level of your breast (if you are seated).  Make sure that your baby's abdomen is facing your abdomen.  Gently massage your breast. With your fingertips, massage from your chest wall toward your nipple in a circular motion. This encourages milk flow. You may need to continue this action during the feeding if your milk flows slowly.  Support your breast with 4 fingers underneath and your thumb above your nipple. Make sure your fingers are well away from your nipple and your baby's mouth.  Stroke your baby's lips gently with your finger or nipple.  When your baby's mouth is open wide enough, quickly bring your baby to your breast, placing your entire nipple and as much of the colored area around your nipple  (areola) as possible into your baby's mouth.  More areola should be visible above your baby's upper lip than below the lower lip.  Your baby's tongue should be between his or her lower gum and your breast.  Ensure that your baby's mouth is correctly positioned around your nipple (latched). Your baby's lips should create a seal on your breast and be turned out (everted).  It is common for your baby to suck for about 2-3 minutes in order to start the flow of breast milk. Signs that your baby has successfully latched on to your nipple include:   Quietly tugging or quietly sucking without causing you pain.   Swallowing heard between every 3-4 sucks.   Muscle movement above and in front of his or her ears with sucking.  Signs that your baby has not successfully latched on to nipple include:   Sucking sounds or smacking sounds from your baby while nursing.   Nipple pain.  Ensure that your breasts stay moisturized and healthy by:  Avoiding the use of soap on your nipples.   Wearing a supportive bra. Avoid wearing underwire-style bras or tight bras.   Air drying your nipples for 3-4 minutes after each feeding.   Using only cotton bra pads to absorb breast milk leakage. Leaking of breast milk between feedings is normal. Be sure to change the pads if they become soaked with milk.  Using lanolin on your nipples after nursing. Lanolin helps to maintain your skin's normal moisture barrier. If you use pure lanolin you do not need to wash it off before feeding your baby again. Pure lanolin   is not toxic to your baby. You may also hand express a few drops of breast milk and gently massage that milk into your nipples, allowing it to air dry. Challenge--Breast Engorgement Breast engorgement is the overfilling of your breasts with breast milk. In the first few weeks after giving birth, you may experience breast engorgement. Breast engorgement can make your breasts throb and feel hard, tightly  stretched, warm, and tender. Engorgement peaks about the fifth day after you give birth. Having breast engorgement does not mean you have to stop breastfeeding your baby. Solution  Breastfeed when you feel the need to reduce the fullness of your breasts or when your baby shows signs of hunger. This is called "breastfeeding on demand."  Newborns (babies younger than 4 weeks) often breastfeed every 1-3 hours during the day. You may need to awaken your baby to feed if he or she is asleep at a feeding time.  Do not allow your baby to sleep longer than 5 hours during the night without a feeding.  Pump or hand express breast milk before breastfeeding to soften your breast, areola, and nipple.  Apply warm, moist heat (in the shower or with warm water-soaked hand towels) just before feeding or pumping, or massage your breast before or during breastfeeding. This increases circulation and helps your milk to flow.  Completely empty your breasts when breastfeeding or pumping. Afterward, wear a snug bra (nursing or regular) or tank top for 1-2 days to signal your body to slightly decrease milk production. Only wear snug bras or tank tops to treat engorgement. Tight bras typically should be avoided by breastfeeding mothers. Once engorgement is relieved, return to wearing regular, loose-fitting clothes.  Apply ice packs to your breasts to lessen the pain from engorgement and relieve swelling, unless the ice is uncomfortable for you.  Do not delay feedings. Try to relax when it is time to feed your baby. This helps to trigger your "let-down reflex," which releases milk from your breast.  Ensure your baby is latched on to your breast and positioned properly while breastfeeding.  Allow your baby to remain at your breast as long as he or she is latched on well and actively sucking. Your baby will let you know when he or she is done breastfeeding by pulling away from your breast or falling asleep.  Avoid  introducing bottles or pacifiers to your baby in the early weeks of breastfeeding. Wait to introduce these things until after resolving any breastfeeding challenges.  Try to pump your milk on the same schedule as when your baby would breastfeed if you are returning to work or away from home for an extended period.  Drink plenty of fluids to avoid dehydration, which can eventually put you at greater risk of breast engorgement. If you follow these suggestions, your engorgement should improve in 24-48 hours. If you are still experiencing difficulty, call your lactation consultant or health care provider.  Challenge--Plugged Milk Ducts Plugged milk ducts occur when the duct does not drain milk effectively and becomes swollen. Wearing a tight-fitting nursing bra or having difficulty with latching may cause plugged milk ducts. Not drinking enough water (8-10 c [1.9-2.4 L] per day) can contribute to plugged milk ducts. Once a duct has become plugged, hard lumps, soreness, and redness may develop in your breast.  Solution Do not delay feedings. Feed your baby frequently and try to empty your breasts of milk at each feeding. Try breastfeeding from the affected side first so there is a   better chance that the milk will drain completely from that breast. Apply warm, moist towels to your breasts for 5-10 minutes before feeding. Alternatively, a hot shower right before breastfeeding can provide the moist heat that can encourage milk flow. Gentle massage of the sore area before and during a feeding may also help. Avoid wearing tight clothing or bras that put pressure on your breasts. Wear bras that offer good support to your breasts, but avoid underwire bras. If you have a plugged milk duct and develop a fever, you need to see your health care provider.  Challenge--Mastitis Mastitis is inflammation of your breast. It usually is caused by a bacterial infection and can cause flu-like symptoms. You may develop redness in  your breast and a fever. Often when mastitis occurs, your breast becomes firm, warm, and very painful. The most common causes of mastitis are poor latching, ineffective sucking from your baby, consistent pressure on your breast (possibly from wearing a tight-fitting bra or shirt that restricts the milk flow), unusual stress or fatigue, or missed feedings.  Solution You will be given antibiotic medicine to treat the infection. It is still important to breastfeed frequently to empty your breasts. Continuing to breastfeed while you recover from mastitis will not harm your baby. Make sure your baby is positioned properly during every feeding. Apply moist heat to your breasts for a few minutes before feeding to help the milk flow and to help your breasts empty more easily. Challenge--Thrush Thrush is a yeast infection that can form on your nipples, in your breast, or in your baby's mouth. It causes itching, soreness, burning or stabbing pain, and sometimes a rash.  Solution You will be given a medicated ointment for your nipples, and your baby will be given a liquid medicine for his or her mouth. It is important that you and your baby are treated at the same time because thrush can be passed between you and your baby. Change disposable nursing pads often. Any bras, towels, or clothing that come in contact with infected areas of your body or your baby's body need to be washed in very hot water every day. Wash your hands and your baby's hands often. All pacifiers, bottle nipples, or toys your baby puts in his or her mouth should be boiled once a day for 20 minutes. After 1 week of treatment, discard pacifiers and bottle nipples and buy new ones. All breast pump parts that touch the milk need to be boiled for 20 minutes every day. Challenge--Low Milk Supply You may not be producing enough milk if your baby is not gaining the proper amount of weight. Breast milk production is based on a supply-and-demand system. Your  milk supply depends on how frequently and effectively your baby empties your breast. Solution The more you breastfeed and pump, the more breast milk you will produce. It is important that your baby empties at least one of your breasts at each feeding. If this is not happening, then use a breast pump or hand express any milk that remains. This will help to drain as much milk as possible at each feeding. It will also signal your body to produce more milk. If your baby is not emptying your breasts, it may be due to latching, sucking, or positioning problems. If low milk supply continues after addressing these issues, contact your health care provider or a lactation specialist as soon as possible. Challenge--Inverted or Flat Nipples Some women have nipples that turn inward instead of protruding outward.   Other women have nipples that are flat. Inverted or flat nipples can sometimes make it more difficult for your baby to latch onto your breast. Solution You may be given a small device that pulls out inverted nipples. This device should be applied right before your baby is brought to your breast. You can also try using a breast pump for a short time before placing the baby at your breast. The pump can pull your nipple outwards to help your infant latch more easily. The baby's sucking motion will help the inverted nipple protrude as well.  If you have flat nipples, encourage your baby to latch onto your breast and feed frequently in the early days after birth. This will give your baby practice latching on correctly while your breast is still soft. When your milk supply increases, between the second and fifth day after birth and your breasts become full, your baby will have an easier time latching.  Contact a lactation consultant if you still have concerns. She or he can teach you additional techniques to address breastfeeding problems related to nipple shape and position.  FOR MORE INFORMATION [employer]: www.llli.org   This information is not intended to replace advice given to you by your health care provider. Make sure you discuss any questions you have with your health care provider.   Document Released: 05/18/2006 Document Revised: 12/15/2014 Document Reviewed: 05/20/2013 Elsevier Interactive Patient Education 2016 [employer]   

## 2016-03-06 ENCOUNTER — Encounter: Payer: Self-pay | Admitting: *Deleted

## 2016-03-10 ENCOUNTER — Ambulatory Visit (INDEPENDENT_AMBULATORY_CARE_PROVIDER_SITE_OTHER): Payer: BLUE CROSS/BLUE SHIELD | Admitting: Advanced Practice Midwife

## 2016-03-10 VITALS — BP 102/66 | HR 92 | Wt 184.0 lb

## 2016-03-10 DIAGNOSIS — Z3483 Encounter for supervision of other normal pregnancy, third trimester: Secondary | ICD-10-CM

## 2016-03-10 DIAGNOSIS — B951 Streptococcus, group B, as the cause of diseases classified elsewhere: Secondary | ICD-10-CM

## 2016-03-10 DIAGNOSIS — Z3493 Encounter for supervision of normal pregnancy, unspecified, third trimester: Secondary | ICD-10-CM

## 2016-03-10 DIAGNOSIS — O2343 Unspecified infection of urinary tract in pregnancy, third trimester: Secondary | ICD-10-CM

## 2016-03-10 NOTE — Progress Notes (Signed)
Subjective:  Kathy Nichols is a 30 y.o. G2P1001 at 928w6d being seen today for ongoing prenatal care.  She is currently monitored for the following issues for this low-risk pregnancy and has Normal pregnancy and GBS (group B streptococcus) UTI complicating pregnancy on her problem list.  Patient reports occasional contractions and Increased pelvic pressure, decreased FM that resolved. .  Contractions: Irregular. Vag. Bleeding: None.  Movement: Present. Denies leaking of fluid.   The following portions of the patient's history were reviewed and updated as appropriate: allergies, current medications, past family history, past medical history, past social history, past surgical history and problem list. Problem list updated.  Objective:   Filed Vitals:   03/10/16 0946  BP: 102/66  Pulse: 92  Weight: 184 lb (83.462 kg)    Fetal Status: Fetal Heart Rate (bpm): 142 Fundal Height: 39 cm Movement: Present  Presentation: Vertex  General:  Alert, oriented and cooperative. Patient is in no acute distress.  Skin: Skin is warm and dry. No rash noted.   Cardiovascular: Normal heart rate noted  Respiratory: Normal respiratory effort, no problems with respiration noted  Abdomen: Soft, gravid, appropriate for gestational age. Pain/Pressure: Present     Pelvic: Vag. Bleeding: None Vag D/C Character: Thin   Cervical exam performed Dilation: 5 Effacement (%): 70 Station: -2  Extremities: Normal range of motion.  Edema: Trace  Mental Status: Normal mood and affect. Normal behavior. Normal judgment and thought content.   Urinalysis: Urine Protein: Negative Urine Glucose: Negative  Assessment and Plan:  Pregnancy: G2P1001 at 6428w6d  There are no diagnoses linked to this encounter. Term labor symptoms and general obstetric precautions including but not limited to vaginal bleeding, contractions, leaking of fluid and fetal movement were reviewed in detail with the patient. Please refer to After Visit Summary  for other counseling recommendations.  Return in about 1 week (around 03/17/2016).  Offered NST, but baby active now so she declined.  [facility] any increased contractions or pressure due to advanced dilation and need for GBS prophylaxis.   Dorathy KinsmanVirginia Marley Charlot, CNM

## 2016-03-10 NOTE — Patient Instructions (Signed)
Fetal Movement Counts  Patient Name: __________________________________________________ Patient Due Date: ____________________  Performing a fetal movement count is highly recommended in high-risk pregnancies, but it is good for every pregnant woman to do. Your health care provider may ask you to start counting fetal movements at 28 weeks of the pregnancy. Fetal movements often increase:  · After eating a full meal.  · After physical activity.  · After eating or drinking something sweet or cold.  · At rest.  Pay attention to when you feel the baby is most active. This will help you notice a pattern of your baby's sleep and wake cycles and what factors contribute to an increase in fetal movement. It is important to perform a fetal movement count at the same time each day when your baby is normally most active.   HOW TO COUNT FETAL MOVEMENTS  1. Find a quiet and comfortable area to sit or lie down on your left side. Lying on your left side provides the best blood and oxygen circulation to your baby.  2. Write down the day and time on a sheet of paper or in a journal.  3. Start counting kicks, flutters, swishes, rolls, or jabs in a 2-hour period. You should feel at least 10 movements within 2 hours.  4. If you do not feel 10 movements in 2 hours, wait 2-3 hours and count again. Look for a change in the pattern or not enough counts in 2 hours.  SEEK MEDICAL CARE IF:  · You feel less than 10 counts in 2 hours, tried twice.  · There is no movement in over an hour.  · The pattern is changing or taking longer each day to reach 10 counts in 2 hours.  · You feel the baby is not moving as he or she usually does.  Date: ____________ Movements: ____________ Start time: ____________ Finish time: ____________   Date: ____________ Movements: ____________ Start time: ____________ Finish time: ____________  Date: ____________ Movements: ____________ Start time: ____________ Finish time: ____________  Date: ____________ Movements:  ____________ Start time: ____________ Finish time: ____________  Date: ____________ Movements: ____________ Start time: ____________ Finish time: ____________  Date: ____________ Movements: ____________ Start time: ____________ Finish time: ____________  Date: ____________ Movements: ____________ Start time: ____________ Finish time: ____________  Date: ____________ Movements: ____________ Start time: ____________ Finish time: ____________   Date: ____________ Movements: ____________ Start time: ____________ Finish time: ____________  Date: ____________ Movements: ____________ Start time: ____________ Finish time: ____________  Date: ____________ Movements: ____________ Start time: ____________ Finish time: ____________  Date: ____________ Movements: ____________ Start time: ____________ Finish time: ____________  Date: ____________ Movements: ____________ Start time: ____________ Finish time: ____________  Date: ____________ Movements: ____________ Start time: ____________ Finish time: ____________  Date: ____________ Movements: ____________ Start time: ____________ Finish time: ____________   Date: ____________ Movements: ____________ Start time: ____________ Finish time: ____________  Date: ____________ Movements: ____________ Start time: ____________ Finish time: ____________  Date: ____________ Movements: ____________ Start time: ____________ Finish time: ____________  Date: ____________ Movements: ____________ Start time: ____________ Finish time: ____________  Date: ____________ Movements: ____________ Start time: ____________ Finish time: ____________  Date: ____________ Movements: ____________ Start time: ____________ Finish time: ____________  Date: ____________ Movements: ____________ Start time: ____________ Finish time: ____________   Date: ____________ Movements: ____________ Start time: ____________ Finish time: ____________  Date: ____________ Movements: ____________ Start time: ____________ Finish  time: ____________  Date: ____________ Movements: ____________ Start time: ____________ Finish time: ____________  Date: ____________ Movements: ____________ Start time:   ____________ Finish time: ____________  Date: ____________ Movements: ____________ Start time: ____________ Finish time: ____________  Date: ____________ Movements: ____________ Start time: ____________ Finish time: ____________  Date: ____________ Movements: ____________ Start time: ____________ Finish time: ____________   Date: ____________ Movements: ____________ Start time: ____________ Finish time: ____________  Date: ____________ Movements: ____________ Start time: ____________ Finish time: ____________  Date: ____________ Movements: ____________ Start time: ____________ Finish time: ____________  Date: ____________ Movements: ____________ Start time: ____________ Finish time: ____________  Date: ____________ Movements: ____________ Start time: ____________ Finish time: ____________  Date: ____________ Movements: ____________ Start time: ____________ Finish time: ____________  Date: ____________ Movements: ____________ Start time: ____________ Finish time: ____________   Date: ____________ Movements: ____________ Start time: ____________ Finish time: ____________  Date: ____________ Movements: ____________ Start time: ____________ Finish time: ____________  Date: ____________ Movements: ____________ Start time: ____________ Finish time: ____________  Date: ____________ Movements: ____________ Start time: ____________ Finish time: ____________  Date: ____________ Movements: ____________ Start time: ____________ Finish time: ____________  Date: ____________ Movements: ____________ Start time: ____________ Finish time: ____________  Date: ____________ Movements: ____________ Start time: ____________ Finish time: ____________   Date: ____________ Movements: ____________ Start time: ____________ Finish time: ____________  Date: ____________  Movements: ____________ Start time: ____________ Finish time: ____________  Date: ____________ Movements: ____________ Start time: ____________ Finish time: ____________  Date: ____________ Movements: ____________ Start time: ____________ Finish time: ____________  Date: ____________ Movements: ____________ Start time: ____________ Finish time: ____________  Date: ____________ Movements: ____________ Start time: ____________ Finish time: ____________  Date: ____________ Movements: ____________ Start time: ____________ Finish time: ____________   Date: ____________ Movements: ____________ Start time: ____________ Finish time: ____________  Date: ____________ Movements: ____________ Start time: ____________ Finish time: ____________  Date: ____________ Movements: ____________ Start time: ____________ Finish time: ____________  Date: ____________ Movements: ____________ Start time: ____________ Finish time: ____________  Date: ____________ Movements: ____________ Start time: ____________ Finish time: ____________  Date: ____________ Movements: ____________ Start time: ____________ Finish time: ____________     This information is not intended to replace advice given to you by your health care provider. Make sure you discuss any questions you have with your health care provider.     Document Released: 12/24/2006 Document Revised: 12/15/2014 Document Reviewed: 09/20/2012  Elsevier Interactive Patient Education ©2016 [employer]

## 2016-03-11 ENCOUNTER — Encounter (HOSPITAL_COMMUNITY): Payer: Self-pay | Admitting: *Deleted

## 2016-03-11 ENCOUNTER — Inpatient Hospital Stay (HOSPITAL_COMMUNITY)
Admission: AD | Admit: 2016-03-11 | Discharge: 2016-03-13 | DRG: 774 | Disposition: A | Discharge status: Home / Self Care | Payer: BLUE CROSS/BLUE SHIELD | Source: Ambulatory Visit | Attending: Obstetrics & Gynecology | Admitting: Obstetrics & Gynecology

## 2016-03-11 DIAGNOSIS — Z833 Family history of diabetes mellitus: Secondary | ICD-10-CM

## 2016-03-11 DIAGNOSIS — B951 Streptococcus, group B, as the cause of diseases classified elsewhere: Secondary | ICD-10-CM | POA: Diagnosis present

## 2016-03-11 DIAGNOSIS — O99824 Streptococcus B carrier state complicating childbirth: Secondary | ICD-10-CM | POA: Diagnosis not present

## 2016-03-11 DIAGNOSIS — Z8249 Family history of ischemic heart disease and other diseases of the circulatory system: Secondary | ICD-10-CM | POA: Diagnosis not present

## 2016-03-11 DIAGNOSIS — Z3A39 39 weeks gestation of pregnancy: Secondary | ICD-10-CM

## 2016-03-11 DIAGNOSIS — Z3490 Encounter for supervision of normal pregnancy, unspecified, unspecified trimester: Secondary | ICD-10-CM

## 2016-03-11 DIAGNOSIS — O2340 Unspecified infection of urinary tract in pregnancy, unspecified trimester: Secondary | ICD-10-CM

## 2016-03-11 DIAGNOSIS — IMO0001 Reserved for inherently not codable concepts without codable children: Secondary | ICD-10-CM

## 2016-03-11 LAB — CBC
HEMATOCRIT: 34.6 % — AB (ref 36.0–46.0)
Hemoglobin: 11.2 g/dL — ABNORMAL LOW (ref 12.0–15.0)
MCH: 24.8 pg — AB (ref 26.0–34.0)
MCHC: 32.4 g/dL (ref 30.0–36.0)
MCV: 76.7 fL — AB (ref 78.0–100.0)
PLATELETS: 221 10*3/uL (ref 150–400)
RBC: 4.51 MIL/uL (ref 3.87–5.11)
RDW: 17.2 % — AB (ref 11.5–15.5)
WBC: 8.9 10*3/uL (ref 4.0–10.5)

## 2016-03-11 LAB — TYPE AND SCREEN
ABO/RH(D): O POS
Antibody Screen: NEGATIVE

## 2016-03-11 MED ORDER — DOCUSATE SODIUM 100 MG PO CAPS
100.0000 mg | ORAL_CAPSULE | Freq: Two times a day (BID) | ORAL | Status: DC
  Filled 2016-03-11: qty 1

## 2016-03-11 MED ORDER — SIMETHICONE 80 MG PO CHEW: 80.0000 mg | CHEWABLE_TABLET | ORAL | Status: DC | PRN

## 2016-03-11 MED ORDER — PRENATAL MULTIVITAMIN CH
1.0000 | ORAL_TABLET | Freq: Every day | ORAL | Status: DC
  Administered 2016-03-12 – 2016-03-13 (×2): 1 via ORAL
  Filled 2016-03-11 (×2): qty 1

## 2016-03-11 MED ORDER — OXYTOCIN 10 UNIT/ML IJ SOLN
2.5000 [IU]/h | INTRAVENOUS | Status: DC
  Administered 2016-03-11: 2.5 [IU]/h via INTRAVENOUS
  Filled 2016-03-11: qty 4

## 2016-03-11 MED ORDER — LACTATED RINGERS IV SOLN: 500.0000 mL | INTRAVENOUS | Status: DC | PRN

## 2016-03-11 MED ORDER — CITRIC ACID-SODIUM CITRATE 334-500 MG/5ML PO SOLN: 30.0000 mL | ORAL | Status: DC | PRN

## 2016-03-11 MED ORDER — IBUPROFEN 600 MG PO TABS
600.0000 mg | ORAL_TABLET | Freq: Four times a day (QID) | ORAL | Status: DC
  Administered 2016-03-11 – 2016-03-13 (×8): 600 mg via ORAL
  Filled 2016-03-11 (×8): qty 1

## 2016-03-11 MED ORDER — SODIUM CHLORIDE 0.9 % IV SOLN
2.0000 g | Freq: Once | INTRAVENOUS | Status: DC
  Filled 2016-03-11: qty 2000

## 2016-03-11 MED ORDER — ONDANSETRON HCL 4 MG/2ML IJ SOLN
4.0000 mg | Freq: Four times a day (QID) | INTRAMUSCULAR | Status: DC | PRN
Start: 2016-03-11 — End: 2016-03-11

## 2016-03-11 MED ORDER — ONDANSETRON HCL 4 MG/2ML IJ SOLN: 4.0000 mg | INTRAMUSCULAR | Status: DC | PRN

## 2016-03-11 MED ORDER — WITCH HAZEL-GLYCERIN EX PADS: 1.0000 "application " | MEDICATED_PAD | CUTANEOUS | Status: DC | PRN

## 2016-03-11 MED ORDER — DIBUCAINE 1 % RE OINT: 1.0000 "application " | TOPICAL_OINTMENT | RECTAL | Status: DC | PRN

## 2016-03-11 MED ORDER — ACETAMINOPHEN 325 MG PO TABS: 650.0000 mg | ORAL_TABLET | ORAL | Status: DC | PRN

## 2016-03-11 MED ORDER — OXYCODONE-ACETAMINOPHEN 5-325 MG PO TABS: 2.0000 | ORAL_TABLET | ORAL | Status: DC | PRN

## 2016-03-11 MED ORDER — BENZOCAINE-MENTHOL 20-0.5 % EX AERO
1.0000 "application " | INHALATION_SPRAY | CUTANEOUS | Status: DC | PRN
  Administered 2016-03-11: 1 via TOPICAL
  Filled 2016-03-11: qty 56

## 2016-03-11 MED ORDER — OXYCODONE-ACETAMINOPHEN 5-325 MG PO TABS: 1.0000 | ORAL_TABLET | ORAL | Status: DC | PRN

## 2016-03-11 MED ORDER — DIPHENHYDRAMINE HCL 25 MG PO CAPS: 25.0000 mg | ORAL_CAPSULE | Freq: Four times a day (QID) | ORAL | Status: DC | PRN

## 2016-03-11 MED ORDER — ONDANSETRON HCL 4 MG PO TABS: 4.0000 mg | ORAL_TABLET | ORAL | Status: DC | PRN

## 2016-03-11 MED ORDER — LANOLIN HYDROUS EX OINT: TOPICAL_OINTMENT | CUTANEOUS | Status: DC | PRN

## 2016-03-11 MED ORDER — OXYTOCIN BOLUS FROM INFUSION
500.0000 mL | INTRAVENOUS | Status: DC
  Administered 2016-03-11: 500 mL via INTRAVENOUS

## 2016-03-11 MED ORDER — LACTATED RINGERS IV SOLN
INTRAVENOUS | Status: DC
  Administered 2016-03-11: 21:00:00 via INTRAVENOUS

## 2016-03-11 MED ORDER — LIDOCAINE HCL (PF) 1 % IJ SOLN
30.0000 mL | INTRAMUSCULAR | Status: DC | PRN
  Administered 2016-03-11: 30 mL via SUBCUTANEOUS
  Filled 2016-03-11: qty 30

## 2016-03-11 NOTE — H&P (Signed)
OBSTETRIC ADMISSION HISTORY AND PHYSICAL  Kathy Nichols is a 30 y.o. female G2P1001 with IUP at [redacted]w[redacted]d by L/7 presenting for active labor. She reports +FMs, No LOF, no VB, no blurry vision, headaches or peripheral edema, and RUQ pain.  She plans on breastfeeding. She request condoms for birth control.  Dating: By L/7 --->  Estimated Date of Delivery: 03/18/16  Clinic [facility] Prenatal Labs  Dating LMP, cw [redacted]w[redacted]d [place] Blood type: O/POS/-- (08/29 1049)   Genetic Screen 1 Screen:  wnl, NT wnl  AFP: Neg   NIPS: Antibody:NEG (08/29 1049)  Anatomic [place]  19 wks, normal female Rubella: 4.01 (08/29 1049)  GTT Early:               Third trimester: 81 RPR: NON REAC (08/29 1049)   Flu vaccine  Declines HBsAg: NEGATIVE (08/29 1049)   TDaP vaccine 12/31/15                         Rhogam: NA HIV: NONREACTIVE (08/29 1049)   Baby Food    breast                                           GBS: (For PCN allergy, check sensitivities) Positive Urine   Contraception Condoms Pap:  Circumcision IP   Pediatrician  Dr. Normand Sloop ([place])   Support Person  Baseer (Husband)    Prenatal History/Complications:  Past Medical History: Past Medical History  Diagnosis Date  . Migraine     Past Surgical History: Past Surgical History  Procedure Laterality Date  . Tonsillectomy    . Wisdom tooth extraction      Obstetrical History: OB History    Gravida Para Term Preterm AB TAB SAB Ectopic Multiple Living   Social History: Social History   Social History  . Marital Status: Married    Spouse Name: N/A  . Number of Children: N/A  . Years of Education: N/A   Social History Main Topics  . Smoking status: Never Smoker   . Smokeless tobacco: None  . Alcohol Use: No  . Drug Use: No  . Sexual Activity:    Partners: Male   Other Topics Concern  . None   Social History Narrative    Family History: Family History  Problem Relation Age of Onset  . Diabetes Maternal Grandmother   .  Diabetes Paternal Grandmother   . Thyroid disease Mother   . Thyroid disease Brother   . Hypertension Father   . Glaucoma Paternal Grandfather   . Hyperlipidemia Father     Allergies: No Known Allergies  Prescriptions prior to admission  Medication Sig Dispense Refill Last Dose  . Prenatal Vit-Fe Fumarate-FA (PRENATAL MULTIVITAMIN) TABS tablet Take 1 tablet by mouth daily at 12 noon.   Taking     Review of Systems   All systems reviewed and negative except as stated in HPI  Height  (1.575 m), weight 184 lb (83.462 kg), last menstrual period 06/12/2015, currently breastfeeding. General appearance: alert, cooperative, appears stated age and no distress Lungs: clear to auscultation bilaterally Heart: regular rate and rhythm Abdomen: soft, non-tender; bowel sounds normal Pelvic: adequate Extremities: Homans sign is negative, no sign of DVT  Presentation: cephalic Fetal monitoringBaseline: 150 bpm, Variability: Good {>  6 bpm), Accelerations: Reactive and Decelerations: Absent Uterine activityFrequency: Every 2-3 minutes Dilation: 8 Effacement (%): 100 Station: -1   Prenatal labs: ABO, Rh: --/--/O POS (04/04 2115) Antibody: PENDING (04/04 2115) Rubella: immune RPR: NON REAC (01/23 1050)  HBsAg: NEGATIVE (08/29 1049)  HIV: NONREACTIVE (01/23 1050)  GBS:    1 hr Glucola wnl Genetic screening  wnl Anatomy US wnl  Prenatal Transfer Tool  Maternal Diabetes: No Genetic Screening: Normal Maternal Ultrasounds/Referrals: Normal Fetal Ultrasounds or other Referrals:  None Maternal Substance Abuse:  No Significant Maternal Medications:  None Significant Maternal Lab Results: Lab values include: Group B Strep positive  Results for orders placed or performed during the hospital encounter of 03/11/16 (from the past 24 hour(s))  CBC   Collection Time: 03/11/16  9:15 PM  Result Value Ref Range   WBC 8.9 4.0 - 10.5 K/uL   RBC 4.51 3.87 - 5.11 MIL/uL   Hemoglobin 11.2 (L)  12.0 - 15.0 g/dL   HCT 16.134.6 (L) 09.636.0 - 04.546.0 %   MCV 76.7 (L) 78.0 - 100.0 fL   MCH 24.8 (L) 26.0 - 34.0 pg   MCHC 32.4 30.0 - 36.0 g/dL   RDW 40.917.2 (H) 81.111.5 - 91.415.5 %   Platelets 221 150 - 400 K/uL  Type and screen [facility]   Collection Time: 03/11/16  9:15 PM  Result Value Ref Range   ABO/RH(D) O POS    Antibody Screen PENDING    Sample Expiration 03/14/2016     Patient Active Problem List   Diagnosis Date Noted  . Active labor at term 03/11/2016  . GBS (group B streptococcus) UTI complicating pregnancy 08/10/2015  . Normal pregnancy 08/06/2015    Assessment: Kathy AweSunia Nichols is a 30 y.o. G2P1001 at 466w0d here for active labor  #Labor: laboring, expectant management, likely [place] #Pain: prn #FWB: Cat 1 #ID:  GBS pos, ampicillin ordered #MOF: breast #MOC:condoms #Circ:  inpatient  Federico FlakeKimberly Niles Minela [place], MD  03/11/2016, 9:55 PM

## 2016-03-11 NOTE — MAU Note (Signed)
Pt c/o contractions every 5 mins. Denies LOF or vag bleeding. +FM. Was 5cm in office yesterday.

## 2016-03-12 ENCOUNTER — Encounter (HOSPITAL_COMMUNITY): Payer: Self-pay | Admitting: *Deleted

## 2016-03-12 LAB — ABO/RH: ABO/RH(D): O POS

## 2016-03-12 LAB — RPR: RPR: NONREACTIVE

## 2016-03-12 NOTE — Lactation Note (Signed)
This note was copied from a baby's chart. Lactation Consultation Note Experienced mom Bf her now 30 yr old for 15 months. Mom was pregnant when she stopped BF older child. Mom states she has colostrum and no difficulty latching when baby is awake. Baby is sleepy. Mom encouraged to feed baby 8-12 times/24 hours and with feeding cues. Referred to Baby and Me Book in Breastfeeding section Pg. 22-23 for position options and Proper latch demonstration.Mom encouraged to do skin-to-skin.WH/LC brochure given w/resources, support groups and LC services. Patient Name: Boy Katheran AweSunia Ailey RUEAV'WToday's Date: 03/12/2016 Reason for consult: Initial assessment   Maternal Data Has patient been taught Hand Expression?: Yes Does the patient have breastfeeding experience prior to this delivery?: Yes  Feeding Feeding Type: Breast Fed Length of feed: 15 min  LATCH Score/Interventions Latch: Too sleepy or reluctant, no latch achieved, no sucking elicited. Intervention(s): Skin to skin;Teach feeding cues;Waking techniques     Type of Nipple: Everted at rest and after stimulation  Comfort (Breast/Nipple): Soft / non-tender     Hold (Positioning): No assistance needed to correctly position infant at breast.     Lactation Tools Discussed/Used     Consult Status Consult Status: Follow-up Date: 03/13/16 Follow-up type: In-patient    Charyl DancerCARVER, Kyaira Trantham G 03/12/2016, 4:42 AM

## 2016-03-12 NOTE — Progress Notes (Signed)
Patient ID: Kathy Nichols, female   DOB: 1986-08-21, 30 y.o.   MRN: [id number]   POSTPARTUM PROGRESS NOTE  Post Partum Day 1 Subjective:  Kathy Nichols is a 30 y.o. G2P1001 PPD#1 s/p SVD at 9575w1d.  No acute events overnight.  Pt denies problems with ambulating, voiding or po intake.  Pain is well-controlled with ibuprofen.  She has not had bowel movement.  Lochia is minimal. She had a boy and is planning on inpatient circumcision. She is breastfeeding and planning on using condoms for birth control.  Objective: Blood pressure 103/58, pulse 80, temperature 98.2 F (36.8 C), temperature source Oral, resp. rate 20, height 5\' 2"  (1.575 m), weight 83.462 kg (184 lb), last menstrual period 06/12/2015, currently breastfeeding.  Physical Exam:  General: alert, cooperative and no distress Lochia: normal flow Chest: CTAB Heart: RRR no m/r/g Abdomen: soft, fundus firm DVT Evaluation: no calf swelling or tenderness   Recent Labs  03/11/16 2115  HGB 11.2*  HCT 34.6*    Assessment/Plan:  ASSESSMENT: Kathy Nichols is a 30 y.o. G2P1001 PPD#1 s/p SVD at 1175w1d, doing well.  Plan for discharge tomorrow due to lack of GBS treatment; peds will observe baby.   LOS: 1 day   Felton ClintonKali Xu 03/12/2016, 7:33 AM   OB fellow attestation Post Partum Day 1 I have seen and examined this patient and agree with above documentation in the medical student's note.   Kathy Nichols is a 30 y.o. [id number] s/p NSVD.  Pt denies problems with ambulating, voiding or po intake. Pain is well controlled.  Plan for birth control is condoms.  Method of Feeding: Breast  PE:  BP 103/58 mmHg  Pulse 80  Temp(Src) 98.2 F (36.8 C) (Oral)  Resp 20  Ht 5\' 2"  (1.575 m)  Wt 184 lb (83.462 kg)  BMI 33.65 kg/m2  LMP 06/12/2015  Breastfeeding? Unknown Gen: well appearing Heart: reg rate Lungs: normal WOB Fundus firm Ext: soft, no pain, no edema  Plan for discharge: PPD#1 vs #2 pending peds assessment of infant Routine pp  care  Federico FlakeKimberly Niles Elsy Chiang, MD 10:09 AM

## 2016-03-13 MED ORDER — IBUPROFEN 600 MG PO TABS: 600.0000 mg | ORAL_TABLET | Freq: Four times a day (QID) | ORAL | Status: DC | PRN

## 2016-03-13 NOTE — Lactation Note (Addendum)
This note was copied from a baby's chart. Lactation Consultation Note  Patient Name: Kathy Nichols Kathy Nichols: 03/13/2016 Reason for consult: Follow-up assessment   With this mom of a term baby, now 3541 hours old. Mom is an experienced breastfeeder, and states she has sore nipples, and asked why the baby's lips were white. The baby has cobblestoning on his lips, which is from using his lips to stay latched, due to limited tongue mobility. On exam, he had an upper lip frenulum that extends to the gum line, and a mid posterior thick frenulum, that blanches with tongue elevation, indicating it is tight. Mom said her nipples were sore at first with her first child, but she breast fed him well over a year. Mom has lots of colostrum, and the baby's stolls are transitioning, and wets and stools WNL. I assisted mom with positioning herself and baby in cross cradle hold, and bringing the baby to her (not breast to baby, as she was doing), and with a deeper latch, mom states latch much more comfortable. I advised mom to maintain holding her baby and breast, to maintain a deep latch, until she feels this is not needed. I gave mom resources to look up information on tongue- ties, and how to call for an o/p lactation appointment, if needed. I advised mom that since she has such a good milk supply, despite the baby's limited tongue mobility, she may not have to have anything done for the baby. Mom knows to call for questions/concerns. I also gave mom comfort gels, and instructed her in their use and care. Coconut oil also advised, but not with gels, and to avoid lanolin, since it does not have anti fungal or anti bacteria properties. Dr. Ezequiel EssexGable made aware of above.    Maternal Data    Feeding Feeding Type: Breast Fed Length of feed:  (started at 1500)  LATCH Score/Interventions Latch: Grasps breast easily, tongue down, lips flanged, rhythmical sucking. Intervention(s): Waking techniques  Audible Swallowing:  Spontaneous and intermittent  Type of Nipple: Everted at rest and after stimulation  Comfort (Breast/Nipple): Filling, red/small blisters or bruises, mild/mod discomfort  Problem noted: Mild/Moderate discomfort Interventions (Mild/moderate discomfort): Comfort gels  Hold (Positioning): Assistance needed to correctly position infant at breast and maintain latch. Intervention(s): Breastfeeding basics reviewed;Support Pillows;Position options  LATCH Score: 8  Lactation Tools Discussed/Used     Consult Status Consult Status: Complete Follow-up type: Call as needed    Alfred LevinsLee, Aria Pickrell Anne 03/13/2016, 3:14 PM

## 2016-03-13 NOTE — Discharge Summary (Signed)
OB Discharge Summary     Patient Name: Kathy Nichols DOB: 06/28/1986 MRN: [id number]  Date of admission: 03/11/2016 Delivering MD: Lyndel SafeNEWTON, KIMBERLY NILES   Date of discharge: 03/13/2016  Admitting diagnosis: 39w labor, ctx 5 min apart Intrauterine pregnancy: 6710w0d     Secondary diagnosis:  Principal Problem:   NSVD (normal spontaneous vaginal delivery) Active Problems:   Normal pregnancy   GBS (group B streptococcus) UTI complicating pregnancy   Active labor at term   Obstetrical laceration, second degree  Additional problems: none     Discharge diagnosis: Term Pregnancy Delivered                                                                                                Post partum procedures:none  Augmentation: none  Complications: None  Hospital course:  Onset of Labor With Vaginal Delivery     30 y.o. yo [id number] at 5010w0d was admitted in Active Labor on 03/11/2016. Patient had an uncomplicated labor course as follows:  Membrane Rupture Time/Date: 9:21 PM ,03/11/2016   Intrapartum Procedures: Episiotomy: None [1]                                         Lacerations:  2nd degree [3]  Patient had a delivery of a Viable infant. 03/11/2016  Information for the patient's newborn:  Alferd PateeMohammed, Hamazauddin [[id number]][[id number]]  Delivery Method: Vaginal, Spontaneous Delivery (Filed from Delivery Summary)    Pateint had an uncomplicated postpartum course.  She is ambulating, tolerating a regular diet, passing flatus, and urinating well. Patient is discharged home in stable condition on 03/14/2016.    Physical exam  Filed Vitals:   03/12/16 0500 03/12/16 1100 03/12/16 1829 03/13/16 0557  BP: 103/58 106/73 104/66 96/51  Pulse: 80 78 78 82  Temp: 98.2 F (36.8 C) 98.1 F (36.7 C) 98.4 F (36.9 C) 98.3 F (36.8 C)  TempSrc:  Oral Oral   Resp: 20 18 18 20   Height:      Weight:       General: alert and cooperative Lochia: appropriate Uterine Fundus: firm Incision: N/A DVT  Evaluation: No evidence of DVT seen on physical exam. Labs: Lab Results  Component Value Date   WBC 8.9 03/11/2016   HGB 11.2* 03/11/2016   HCT 34.6* 03/11/2016   MCV 76.7* 03/11/2016   PLT 221 03/11/2016   No flowsheet data found.  Discharge instruction: per After Visit Summary and "Baby and Me Booklet".  After visit meds:    Medication List    TAKE these medications        ibuprofen 600 MG tablet  Commonly known as:  ADVIL,MOTRIN  Take 1 tablet (600 mg total) by mouth every 6 (six) hours as needed.     prenatal multivitamin Tabs tablet  Take 1 tablet by mouth daily at 12 noon.        Diet: routine diet  Activity: Advance as tolerated. Pelvic rest for 6 weeks.   Outpatient follow up:6 weeks Follow up Appt:No  future appointments. Follow up Visit:No Follow-up on file.  Postpartum contraception: Condoms  Newborn Data: Live born female  Birth Weight: 7 lb 15 oz (3600 g) APGAR: 8, 9  Baby Feeding: Breast Disposition:home with mother   03/13/2016 Cam Hai, CNM

## 2016-03-13 NOTE — Discharge Instructions (Signed)

## 2016-03-17 ENCOUNTER — Encounter: Payer: BLUE CROSS/BLUE SHIELD | Admitting: Certified Nurse Midwife

## 2016-04-18 ENCOUNTER — Ambulatory Visit (INDEPENDENT_AMBULATORY_CARE_PROVIDER_SITE_OTHER): Payer: BLUE CROSS/BLUE SHIELD | Admitting: Family

## 2016-04-18 ENCOUNTER — Encounter: Payer: Self-pay | Admitting: Family

## 2016-04-18 DIAGNOSIS — F53 Puerperal psychosis: Secondary | ICD-10-CM

## 2016-04-18 NOTE — Progress Notes (Signed)
  Subjective:     Kathy Nichols is a 30 y.o. female who presents for a postpartum visit. She is 5 weeks postpartum following a spontaneous vaginal delivery. I have fully reviewed the prenatal and intrapartum course. The delivery was at 39 gestational weeks. Outcome: spontaneous vaginal delivery. Anesthesia: none. Postpartum course has been unremarkable; increased family at home causing increased stress.  Recently left, feeling better.  No thoughts of harming self or others.. Baby's course has been only remarkable for reflux. Baby is feeding by breast. Bleeding staining only. Bowel function is normal. Bladder function is normal. Patient is not sexually active. Contraception method is abstinence. Postpartum depression screening: positive.  Declines counseling at this time.  Discussed the many stressors of having a toddler and newborn, "needing a break".    The following portions of the patient's history were reviewed and updated as appropriate: allergies, current medications, past family history, past medical history, past social history, past surgical history and problem list.  Review of Systems Pertinent items are noted in HPI.   Objective:    BP 99/61 mmHg  Pulse 66  Ht 5' 2.5" (1.588 m)  Wt 158 lb (71.668 kg)  BMI 28.42 kg/m2  Breastfeeding? Yes        General:  alert, cooperative and appears stated age   Breasts:  inspection negative, no nipple discharge or bleeding, no masses or nodularity palpable  Lungs: clear to auscultation bilaterally  Heart:  regular rate and rhythm, S1, S2 normal, no murmur, click, rub or gallop  Abdomen: soft, non-tender; bowel sounds normal; no masses,  no organomegaly   Vulva:  normal  Vagina: normal vagina, no discharge, exudate, lesion, or erythema; healed well  Cervix:  no cervical motion tenderness  Corpus: normal size, contour, position, consistency, mobility, non-tender  Adnexa:  normal adnexa  Rectal Exam: Not performed.   Assessment:     Normal  postpartum exam. Pap smear not done at today's visit.  Borderline Postpartum Depression Plan:    1. Contraception: condoms 2. Declines counseling; commits to scheduling time alone 2-3 hours once a week 3. Follow up in: 3 weeks or as needed.    Provider will call her.  Eino FarberWalidah Kennith GainN Karim, CNM

## 2017-05-14 ENCOUNTER — Ambulatory Visit: Payer: BLUE CROSS/BLUE SHIELD | Admitting: Obstetrics & Gynecology

## 2017-05-28 ENCOUNTER — Ambulatory Visit: Payer: BLUE CROSS/BLUE SHIELD | Admitting: Obstetrics & Gynecology

## 2017-05-28 ENCOUNTER — Ambulatory Visit (INDEPENDENT_AMBULATORY_CARE_PROVIDER_SITE_OTHER): Payer: BLUE CROSS/BLUE SHIELD | Admitting: Obstetrics & Gynecology

## 2017-05-28 ENCOUNTER — Encounter: Payer: Self-pay | Admitting: Obstetrics & Gynecology

## 2017-05-28 VITALS — BP 87/53 | HR 66 | Resp 16 | Ht 62.0 in | Wt 144.0 lb

## 2017-05-28 DIAGNOSIS — N941 Unspecified dyspareunia: Secondary | ICD-10-CM

## 2017-05-28 DIAGNOSIS — N3941 Urge incontinence: Secondary | ICD-10-CM

## 2017-05-28 DIAGNOSIS — R6882 Decreased libido: Secondary | ICD-10-CM

## 2017-05-28 MED ORDER — TOLTERODINE TARTRATE ER 4 MG PO CP24: 4.0000 mg | ORAL_CAPSULE | Freq: Every day | ORAL | 12 refills | Status: DC

## 2017-05-28 MED ORDER — LIDOCAINE HCL 2 % EX GEL: 1.0000 "application " | CUTANEOUS | 2 refills | Status: DC | PRN

## 2017-05-28 NOTE — Progress Notes (Signed)
   Subjective:    Patient ID: Kathy Nichols, female    DOB: 1985-12-13, 31 y.o.   MRN: [id number]  HPI  31 yo MA P2 here with several issues. 1) intermittent urge incontinence 2) non-existent libido 3) dyspareunia (can't get it in), has tried [place]  Review of Systems     Objective:   Physical Exam [id number] Breathing, conversing, and ambulating normally Normal vulva, on her period, had some pain when I was touching the introitus, but was distracted from the pain easily Normal abdomen, minimal rectus diathesis       Assessment & Plan:  Urge incontinence- I offered a urology referral versus treatment with detrol (WHEN SHE FINISHES BREASTFEEDING COMPLETELY). She would like to try the detrol. If this does not work, then urology referral  Absent libido- refer to [facility] for sexual counseling  dysparuenia- Rec Astroglide and lidocaine jel

## 2017-05-29 LAB — URINE CULTURE: ORGANISM ID, BACTERIA: NO GROWTH

## 2018-03-23 ENCOUNTER — Telehealth: Payer: Self-pay | Admitting: Obstetrics & Gynecology

## 2018-03-23 ENCOUNTER — Telehealth: Payer: Self-pay | Admitting: *Deleted

## 2018-03-23 NOTE — Telephone Encounter (Addendum)
Pt called states going in one month out of country on international trip. Pt does not want to have her period during that month long trip. Pt request medication to stop period short term she says she is not interested in birth control long term just something to help her not have a period next month. Please advise [phone]. Pt last seen 05/2017.

## 2018-03-25 NOTE — Telephone Encounter (Signed)
Error

## 2018-04-08 ENCOUNTER — Ambulatory Visit: Payer: BLUE CROSS/BLUE SHIELD | Admitting: Obstetrics & Gynecology

## 2018-05-26 ENCOUNTER — Ambulatory Visit: Payer: BLUE CROSS/BLUE SHIELD | Admitting: Obstetrics and Gynecology

## 2019-09-16 ENCOUNTER — Encounter: Payer: Self-pay | Admitting: *Deleted

## 2019-09-20 ENCOUNTER — Encounter: Payer: Self-pay | Admitting: Advanced Practice Midwife

## 2019-09-20 ENCOUNTER — Other Ambulatory Visit: Payer: Self-pay

## 2019-09-20 ENCOUNTER — Other Ambulatory Visit: Payer: BC Managed Care – PPO

## 2019-09-20 ENCOUNTER — Ambulatory Visit (INDEPENDENT_AMBULATORY_CARE_PROVIDER_SITE_OTHER): Payer: BC Managed Care – PPO | Admitting: Advanced Practice Midwife

## 2019-09-20 VITALS — BP 89/57 | HR 82 | Wt 155.0 lb

## 2019-09-20 DIAGNOSIS — Z124 Encounter for screening for malignant neoplasm of cervix: Secondary | ICD-10-CM | POA: Diagnosis not present

## 2019-09-20 DIAGNOSIS — Z3492 Encounter for supervision of normal pregnancy, unspecified, second trimester: Secondary | ICD-10-CM

## 2019-09-20 DIAGNOSIS — Z113 Encounter for screening for infections with a predominantly sexual mode of transmission: Secondary | ICD-10-CM

## 2019-09-20 DIAGNOSIS — O219 Vomiting of pregnancy, unspecified: Secondary | ICD-10-CM | POA: Diagnosis not present

## 2019-09-20 DIAGNOSIS — Z363 Encounter for antenatal screening for malformations: Secondary | ICD-10-CM

## 2019-09-20 DIAGNOSIS — Z3A18 18 weeks gestation of pregnancy: Secondary | ICD-10-CM | POA: Diagnosis not present

## 2019-09-20 DIAGNOSIS — Z1151 Encounter for screening for human papillomavirus (HPV): Secondary | ICD-10-CM

## 2019-09-20 DIAGNOSIS — Z3480 Encounter for supervision of other normal pregnancy, unspecified trimester: Secondary | ICD-10-CM | POA: Insufficient documentation

## 2019-09-20 MED ORDER — PROMETHAZINE HCL 25 MG PO TABS: 25.0000 mg | ORAL_TABLET | Freq: Four times a day (QID) | ORAL | 2 refills | Status: DC | PRN

## 2019-09-20 MED ORDER — BONJESTA 20-20 MG PO TBCR: 1.0000 | EXTENDED_RELEASE_TABLET | ORAL | 1 refills | Status: DC

## 2019-09-20 NOTE — Patient Instructions (Signed)
First Trimester of Pregnancy The first trimester of pregnancy is from week 1 until the end of week 13 (months 1 through 3). A week after a sperm fertilizes an egg, the egg will implant on the wall of the uterus. This embryo will begin to develop into a baby. Genes from you and your partner will form the baby. The female genes will determine whether the baby will be a boy or a girl. At 6-8 weeks, the eyes and face will be formed, and the heartbeat can be seen on ultrasound. At the end of 12 weeks, all the baby's organs will be formed. Now that you are pregnant, you will want to do everything you can to have a healthy baby. Two of the most important things are to get good prenatal care and to follow your health care provider's instructions. Prenatal care is all the medical care you receive before the baby's birth. This care will help prevent, find, and treat any problems during the pregnancy and childbirth. Body changes during your first trimester Your body goes through many changes during pregnancy. The changes vary from woman to woman.  You may gain or lose a couple of pounds at first.  You may feel sick to your stomach (nauseous) and you may throw up (vomit). If the vomiting is uncontrollable, call your health care provider.  You may tire easily.  You may develop headaches that can be relieved by medicines. All medicines should be approved by your health care provider.  You may urinate more often. Painful urination may mean you have a bladder infection.  You may develop heartburn as a result of your pregnancy.  You may develop constipation because certain hormones are causing the muscles that push stool through your intestines to slow down.  You may develop hemorrhoids or swollen veins (varicose veins).  Your breasts may begin to grow larger and become tender. Your nipples may stick out more, and the tissue that surrounds them (areola) may become darker.  Your gums may bleed and may be  sensitive to brushing and flossing.  Dark spots or blotches (chloasma, mask of pregnancy) may develop on your face. This will likely fade after the baby is born.  Your menstrual periods will stop.  You may have a loss of appetite.  You may develop cravings for certain kinds of food.  You may have changes in your emotions from day to day, such as being excited to be pregnant or being concerned that something may go wrong with the pregnancy and baby.  You may have more vivid and strange dreams.  You may have changes in your hair. These can include thickening of your hair, rapid growth, and changes in texture. Some women also have hair loss during or after pregnancy, or hair that feels dry or thin. Your hair will most likely return to normal after your baby is born. What to expect at prenatal visits During a routine prenatal visit:  You will be weighed to make sure you and the baby are growing normally.  Your blood pressure will be taken.  Your abdomen will be measured to track your baby's growth.  The fetal heartbeat will be listened to between weeks 10 and 14 of your pregnancy.  Test results from any previous visits will be discussed. Your health care provider may ask you:  How you are feeling.  If you are feeling the baby move.  If you have had any abnormal symptoms, such as leaking fluid, bleeding, severe headaches, or abdominal   cramping.  If you are using any tobacco products, including cigarettes, chewing tobacco, and electronic cigarettes.  If you have any questions. Other tests that may be performed during your first trimester include:  Blood tests to find your blood type and to check for the presence of any previous infections. The tests will also be used to check for low iron levels (anemia) and protein on red blood cells (Rh antibodies). Depending on your risk factors, or if you previously had diabetes during pregnancy, you may have tests to check for high blood sugar  that affects pregnant women (gestational diabetes).  Urine tests to check for infections, diabetes, or protein in the urine.  An ultrasound to confirm the proper growth and development of the baby.  Fetal screens for spinal cord problems (spina bifida) and Down syndrome.  HIV (human immunodeficiency virus) testing. Routine prenatal testing includes screening for HIV, unless you choose not to have this test.  You may need other tests to make sure you and the baby are doing well. Follow these instructions at home: Medicines  Follow your health care provider's instructions regarding medicine use. Specific medicines may be either safe or unsafe to take during pregnancy.  Take a prenatal vitamin that contains at least 600 micrograms (mcg) of folic acid.  If you develop constipation, try taking a stool softener if your health care provider approves. Eating and drinking   Eat a balanced diet that includes fresh fruits and vegetables, whole grains, good sources of protein such as meat, eggs, or tofu, and low-fat dairy. Your health care provider will help you determine the amount of weight gain that is right for you.  Avoid raw meat and uncooked cheese. These carry germs that can cause birth defects in the baby.  Eating four or five small meals rather than three large meals a day may help relieve nausea and vomiting. If you start to feel nauseous, eating a few soda crackers can be helpful. Drinking liquids between meals, instead of during meals, also seems to help ease nausea and vomiting.  Limit foods that are high in fat and processed sugars, such as fried and sweet foods.  To prevent constipation: ? Eat foods that are high in fiber, such as fresh fruits and vegetables, whole grains, and beans. ? Drink enough fluid to keep your urine clear or pale yellow. Activity  Exercise only as directed by your health care provider. Most women can continue their usual exercise routine during  pregnancy. Try to exercise for 30 minutes at least 5 days a week. Exercising will help you: ? Control your weight. ? Stay in shape. ? Be prepared for labor and delivery.  Experiencing pain or cramping in the lower abdomen or lower back is a good sign that you should stop exercising. Check with your health care provider before continuing with normal exercises.  Try to avoid standing for long periods of time. Move your legs often if you must stand in one place for a long time.  Avoid heavy lifting.  Wear low-heeled shoes and practice good posture.  You may continue to have sex unless your health care provider tells you not to. Relieving pain and discomfort  Wear a good support bra to relieve breast tenderness.  Take warm sitz baths to soothe any pain or discomfort caused by hemorrhoids. Use hemorrhoid cream if your health care provider approves.  Rest with your legs elevated if you have leg cramps or low back pain.  If you develop varicose veins in   your legs, wear support hose. Elevate your feet for 15 minutes, 3-4 times a day. Limit salt in your diet. Prenatal care  Schedule your prenatal visits by the twelfth week of pregnancy. They are usually scheduled monthly at first, then more often in the last 2 months before delivery.  Write down your questions. Take them to your prenatal visits.  Keep all your prenatal visits as told by your health care provider. This is important. Safety  Wear your seat belt at all times when driving.  Make a list of emergency phone numbers, including numbers for family, friends, the hospital, and police and fire departments. General instructions  Ask your health care provider for a referral to a local prenatal education class. Begin classes no later than the beginning of month 6 of your pregnancy.  Ask for help if you have counseling or nutritional needs during pregnancy. Your health care provider can offer advice or refer you to specialists for help  with various needs.  Do not use hot tubs, steam rooms, or saunas.  Do not douche or use tampons or scented sanitary pads.  Do not cross your legs for long periods of time.  Avoid cat litter boxes and soil used by cats. These carry germs that can cause birth defects in the baby and possibly loss of the fetus by miscarriage or stillbirth.  Avoid all smoking, herbs, alcohol, and medicines not prescribed by your health care provider. Chemicals in these products affect the formation and growth of the baby.  Do not use any products that contain nicotine or tobacco, such as cigarettes and e-cigarettes. If you need help quitting, ask your health care provider. You may receive counseling support and other resources to help you quit.  Schedule a dentist appointment. At home, brush your teeth with a soft toothbrush and be gentle when you floss. Contact a health care provider if:  You have dizziness.  You have mild pelvic cramps, pelvic pressure, or nagging pain in the abdominal area.  You have persistent nausea, vomiting, or diarrhea.  You have a bad smelling vaginal discharge.  You have pain when you urinate.  You notice increased swelling in your face, hands, legs, or ankles.  You are exposed to fifth disease or chickenpox.  You are exposed to German measles (rubella) and have never had it. Get help right away if:  You have a fever.  You are leaking fluid from your vagina.  You have spotting or bleeding from your vagina.  You have severe abdominal cramping or pain.  You have rapid weight gain or loss.  You vomit blood or material that looks like coffee grounds.  You develop a severe headache.  You have shortness of breath.  You have any kind of trauma, such as from a fall or a car accident. Summary  The first trimester of pregnancy is from week 1 until the end of week 13 (months 1 through 3).  Your body goes through many changes during pregnancy. The changes vary from  woman to woman.  You will have routine prenatal visits. During those visits, your health care provider will examine you, discuss any test results you may have, and talk with you about how you are feeling. This information is not intended to replace advice given to you by your health care provider. Make sure you discuss any questions you have with your health care provider. Document Released: 11/18/2001 Document Revised: 11/06/2017 Document Reviewed: 11/05/2016 Elsevier Patient Education  2020 [employer]  

## 2019-09-20 NOTE — Progress Notes (Signed)
Subjective:   Kathy Nichols is a 33 y.o. G3P2002 at [redacted]w[redacted]d by LMP, c/w early ultrasound being seen today for her first obstetrical visit. Her obstetrical history is significant for previous low risk, spontaneous vaginal delivery. Pregnancy history fully reviewed.  Patient reports fatigue and nausea. Nausea is improving, but would like to have medication to help. She denies cramping, vaginal bleeding or discharge.  HISTORY: OB History  Gravida Para Term Preterm AB Living  3 2 2  0 0 2  SAB TAB Ectopic Multiple Live Births  0 0 0 0 2    # Outcome Date GA Lbr Len/2nd Weight Sex Delivery Anes PTL Lv  3 Current           2 Term 03/11/16 [redacted]w[redacted]d 01:42 / 00:12 3600 g M Vag-Spont None  LIV     Name: Kathy Nichols     Apgar1: 8  Apgar5: 9  1 Term 06/13/14 [redacted]w[redacted]d  2608 g M Vag-Spont EPI N LIV     Complications: Laceration   Past Medical History:  Diagnosis Date  . Migraine    Past Surgical History:  Procedure Laterality Date  . TONSILLECTOMY    . WISDOM TOOTH EXTRACTION     Family History  Problem Relation Age of Onset  . Diabetes Maternal Grandmother   . Diabetes Paternal Grandmother   . Thyroid disease Mother   . Thyroid disease Brother   . Hypertension Father   . Hyperlipidemia Father   . Glaucoma Paternal Grandfather    Social History   Tobacco Use  . Smoking status: Never Smoker  . Smokeless tobacco: Never Used  Substance Use Topics  . Alcohol use: No    Alcohol/week: 0.0 standard drinks  . Drug use: No   No Known Allergies Current Outpatient Medications on File Prior to Visit  Medication Sig Dispense Refill  . Prenatal Vit-Fe Fumarate-FA (PRENATAL MULTIVITAMIN) TABS tablet Take 1 tablet by mouth daily at 12 noon.     No current facility-administered medications on file prior to visit.     Indications for ASA therapy (per uptodate) One of the following: Previous pregnancy with preeclampsia, especially early onset and with an adverse outcome No Multifetal  gestation No Chronic hypertension No Type 1 or 2 diabetes mellitus No Chronic kidney disease No Autoimmune disease (antiphospholipid syndrome, systemic lupus erythematosus) No  Two or more of the following: Nulliparity No Obesity (body mass index >30 kg/m2) No Family history of preeclampsia in mother or sister No Age ?35 years No Sociodemographic characteristics (African American race, low socioeconomic level) No Personal risk factors (eg, previous pregnancy with low birth weight or small for gestational age infant, previous adverse pregnancy outcome [eg, stillbirth], interval >10 years between pregnancies) No  Indications for early 1 hour GTT (per uptodate)  BMI >25 (>23 in Asian women) AND one of the following  Gestational diabetes mellitus in a previous pregnancy No Glycated hemoglobin ?5.7 percent (39 mmol/mol), impaired glucose tolerance, or impaired fasting glucose on previous testing No First-degree relative with diabetes No High-risk race/ethnicity (eg, African American, Latino, Native American, [place] American, Pacific Islander) Yes History of cardiovascular disease No Hypertension or on therapy for hypertension No High-density lipoprotein cholesterol level <35 mg/dL (0.90 mmol/L) and/or a triglyceride level >250 mg/dL (2.82 mmol/L) No Polycystic ovary syndrome No Physical inactivity No Other clinical condition associated with insulin resistance (eg, severe obesity, acanthosis nigricans) No Previous birth of an infant weighing ?4000 g No Previous stillbirth of unknown cause No   Exam  Vitals:   09/20/19 0901  BP: (!) 89/57  Pulse: 82  Weight: 70.3 kg   Fetal Heart Rate (bpm): 165  Uterus:     Pelvic Exam: Perineum: no hemorrhoids, normal perineum   Vulva: normal external genitalia, no lesions   Vagina:  normal pink mucosa, normal discharge   Cervix: no lesions, pink, pap smear obtained    Adnexa: normal adnexa and no mass, fullness, tenderness   Bony Pelvis:  average  System: General: well-developed, well-nourished female in no acute distress   Breast:  normal appearance, no masses or tenderness   Skin: normal coloration and turgor, no rashes   Neurologic: oriented, normal, negative, normal mood   Extremities: normal strength, tone, and muscle mass, ROM of all joints is normal   HEENT PERRLA, extraocular movement intact and sclera clear, anicteric   Mouth/Teeth mucous membranes moist, pharynx normal without lesions and dental hygiene good   Neck supple and no masses   Cardiovascular: regular rate and rhythm   Respiratory:  no respiratory distress, normal breath sounds   Abdomen: soft, non-tender; bowel sounds normal; no masses,  no organomegaly     Assessment:   Pregnancy: [id number] Patient Active Problem List   Diagnosis Date Noted  . Supervision of other normal pregnancy, antepartum 09/20/2019     Plan:  1. Supervision of other normal pregnancy, antepartum - Routine NOB appointment - NOB labs and PAP today - Flu shot today - Anticipatory guidance for upcoming appointments  - Obstetric panel - HIV antibody (with reflex) - Culture, OB Urine - Hemoglobinopathy Evaluation - Cytology - PAP( [place]) - Flu Vaccine QUAD 36+ mos IM (Fluarix, Quad PF) - [place] MFM OB COMP + 14 WK; Future - Babyscripts Schedule Optimization  2. [redacted] weeks gestation of pregnancy - Anatomy scan ordered  - [place] MFM OB COMP + 14 WK; Future - Babyscripts Schedule Optimization  3. Encounter for antenatal screening for malformation - Patients brother has Asherman Syndrome  - [place] MFM OB COMP + 14 WK; Future - Babyscripts Schedule Optimization  4. Nausea/vomiting in pregnancy - Constant nausea with occasional vomiting - Bonjesta and Phenergan prescribed  - Doxylamine-Pyridoxine ER (BONJESTA) 20-20 MG TBCR; Take 1 tablet by mouth as directed. Take one tablet at night. If nausea continues during the day add one tablet in the morning.  Dispense: 60 tablet;  Refill: 1 - promethazine (PHENERGAN) 25 MG tablet; Take 1 tablet (25 mg total) by mouth every 6 (six) hours as needed for nausea or vomiting.  Dispense: 30 tablet; Refill: 2  Initial labs drawn. Early 1 hour GTT n/a Started on ASA n/a Flu vax today Continue prenatal vitamins. Genetic Screening discussed, NIPS: requested. Pt will verify with insurance company to see if it is covered Ultrasound discussed; fetal anatomic survey: ordered. Problem list reviewed and updated The nature of [facility] with multiple MDs and other Advanced Practice Providers was explained to patient; also emphasized that fellows, residents, and students are part of our team. Routine obstetric precautions reviewed  Return in about 6 weeks (around 11/01/2019) for Virtual Visit.   Brand Males, SNM 9:48 AM 09/20/19

## 2019-09-20 NOTE — Progress Notes (Signed)
Bedside U/S shows single IUP with FHT of 165 BPM and CRL measures 35.73 mm  GA is [redacted]w[redacted]d

## 2019-09-22 ENCOUNTER — Telehealth: Payer: Self-pay

## 2019-09-22 LAB — URINE CULTURE, OB REFLEX: Organism ID, Bacteria: NO GROWTH

## 2019-09-22 LAB — CULTURE, OB URINE

## 2019-09-22 NOTE — Telephone Encounter (Addendum)
Attempted to call pt to discuss information below and to discuss genetic counseling. PT did not answer. VM left asking pt to return call to office. Office phone number provided.    ----- Message from Manya Silvas, [place] sent at 09/22/2019 11:19 AM EDT ----- Regarding: Genetic Counseling I sent a message to Dr. Donalee Citrin about pt's FH of Angelman Syndrome and risk to pt's baby. He states: "The risk of recurrence in the fetus is dependent on the type of inheritance and can vary from 1% to 50%. She will benefit from genetic counseling and prenatal diagnosis is available."   Pt was under the impression that her baby was not at risk. Wasn't clear if this info was from previous Genetic Counseling that I can't see or misinformation. Will you ask if she wants Genetic Counseling appt?   Thanks,   - [place]

## 2019-09-22 NOTE — Telephone Encounter (Signed)
Pt returning my call from earlier. I spoke with pt and she is aware that [place], CNM spoke with Dr.Shankar about family history of Angleman Syndrome and the risk to baby. She is aware that Dr.Shankar's recommendation is genetic counseling. Pt would like to discuss this with her husband and states she will call back and let me know if she would like the referral placed for genetic counseling.

## 2019-09-23 LAB — HEMOGLOBINOPATHY EVALUATION
Fetal Hemoglobin Testing: <1 % (ref 0.0–1.9)
HCT: 42.8 % (ref 35.0–45.0)
Hemoglobin A2 - HGBRFX: 2.4 % (ref 1.8–3.5)
Hemoglobin: 13.5 g/dL (ref 11.7–15.5)
Hgb A: 96.6 % (ref >96.0)
MCH: 25.6 pg — ABNORMAL LOW (ref 27.0–33.0)
MCV: 81.1 fL (ref 80.0–100.0)
RBC: 5.28 10*6/uL — ABNORMAL HIGH (ref 3.80–5.10)
RDW: 14.4 % (ref 11.0–15.0)

## 2019-09-23 LAB — OBSTETRIC PANEL
Absolute Monocytes: 419 cells/uL (ref 200–950)
Antibody Screen: NOT DETECTED
Basophils Absolute: 12 cells/uL (ref 0–200)
Basophils Relative: 0.2 %
Eosinophils Absolute: 18 cells/uL (ref 15–500)
Eosinophils Relative: 0.3 %
HCT: 43.3 % (ref 35.0–45.0)
Hemoglobin: 13.7 g/dL (ref 11.7–15.5)
Hepatitis B Surface Ag: NONREACTIVE
Lymphs Abs: 1499 cells/uL (ref 850–3900)
MCH: 25.8 pg — ABNORMAL LOW (ref 27.0–33.0)
MCHC: 31.6 g/dL — ABNORMAL LOW (ref 32.0–36.0)
MCV: 81.5 fL (ref 80.0–100.0)
MPV: 10.6 fL (ref 7.5–12.5)
Monocytes Relative: 7.1 %
Neutro Abs: 3953 cells/uL (ref 1500–7800)
Neutrophils Relative %: 67 %
Platelets: 230 10*3/uL (ref 140–400)
RBC: 5.31 10*6/uL — ABNORMAL HIGH (ref 3.80–5.10)
RDW: 14.1 % (ref 11.0–15.0)
RPR Ser Ql: NONREACTIVE
Rubella: 3.96 index
Total Lymphocyte: 25.4 %
WBC: 5.9 10*3/uL (ref 3.8–10.8)

## 2019-09-23 LAB — HIV ANTIBODY (ROUTINE TESTING W REFLEX): HIV 1&2 Ab, 4th Generation: NONREACTIVE

## 2019-09-27 ENCOUNTER — Other Ambulatory Visit: Payer: Self-pay

## 2019-09-27 DIAGNOSIS — Q9351 Angelman syndrome: Secondary | ICD-10-CM

## 2019-09-27 LAB — CYTOLOGY - PAP
Chlamydia: NEGATIVE
Comment: NEGATIVE
Comment: NEGATIVE
Comment: NORMAL
Diagnosis: NEGATIVE
High risk HPV: NEGATIVE
Neisseria Gonorrhea: NEGATIVE

## 2019-09-27 NOTE — Progress Notes (Signed)
Referral for genetics placed per Manya Silvas, CNM

## 2019-10-07 ENCOUNTER — Other Ambulatory Visit: Payer: Self-pay

## 2019-10-07 DIAGNOSIS — Z20822 Contact with and (suspected) exposure to covid-19: Secondary | ICD-10-CM

## 2019-10-08 LAB — NOVEL CORONAVIRUS, NAA: SARS-CoV-2, NAA: NOT DETECTED

## 2019-10-10 ENCOUNTER — Other Ambulatory Visit: Payer: Self-pay

## 2019-10-10 ENCOUNTER — Ambulatory Visit (HOSPITAL_COMMUNITY): Payer: Self-pay | Admitting: Genetic Counselor

## 2019-10-10 ENCOUNTER — Ambulatory Visit (HOSPITAL_COMMUNITY): Payer: BC Managed Care – PPO | Attending: Advanced Practice Midwife | Admitting: Genetic Counselor

## 2019-10-10 DIAGNOSIS — Z3A13 13 weeks gestation of pregnancy: Secondary | ICD-10-CM

## 2019-10-10 DIAGNOSIS — Z8489 Family history of other specified conditions: Secondary | ICD-10-CM

## 2019-10-10 DIAGNOSIS — Z36 Encounter for antenatal screening for chromosomal anomalies: Secondary | ICD-10-CM | POA: Diagnosis not present

## 2019-10-10 DIAGNOSIS — Z315 Encounter for genetic counseling: Secondary | ICD-10-CM | POA: Diagnosis not present

## 2019-10-10 NOTE — Progress Notes (Signed)
10/10/2019  Katheran AweSunia Lafevers 10/01/1986 MRN: [id number] DOV: 10/10/2019  Ms. Idelle LeechKhatri [facility] for a genetics consultation regarding her family history of Angelman syndrome. Ms. Idelle LeechKhatri came to her appointment alone due to COVID-19 visitor restrictions.   Indication for genetic counseling - Family history of Angelman syndrome  Prenatal history  Ms. Idelle LeechKhatri is a 143P2002, 33 y.o. female. Her current pregnancy has completed 6225w2d (Estimated Date of Delivery: 04/14/20).  Ms. Idelle LeechKhatri denied exposure to environmental toxins or chemical agents. She denied the use of alcohol, tobacco or street drugs. She reported taking Bonjesta and prenatal vitamins. She denied significant viral illnesses, fevers, and bleeding during the course of her pregnancy. Her medical and surgical histories were noncontributory.  Family History  A three generation pedigree was drafted and reviewed. The family history is remarkable for the following:  - Ms. Minnehan's brother has Angelman syndrome. He was diagnosed in childhood and is now in his 30s. Ms. Idelle LeechKhatri believes that he was diagnosed clinically and has not ever had genetic testing performed. See Discussion section for more details.  - Ms. Idelle LeechKhatri has a son who had strabismus that required surgical correction and frequent otitis media necessitating ear tubes. His medical and developmental history is otherwise noncontributory.   The remaining family histories were reviewed and found to be noncontributory for birth defects, intellectual disability, recurrent pregnancy loss, and known genetic conditions.    The patient's ethnicity is [place]. The father of the pregnancy's ethnicity is [place]. Ashkenazi Jewish ancestry and consanguinity were denied. Pedigree will be scanned under Media.  Discussion  Ms. Idelle LeechKhatri was referred for genetic counseling as her brother has Angelman syndrome. Angelman syndrome is a genetic disorder that  primarily affects the nervous system. Characteristic features include developmental delay, severe speech impairment, intellectual disability, gait ataxia, limb tremors, acquired microcephaly, and epilepsy. Other possible features include feeding difficulties, constipation, gastroesophageal reflux, strabismus, sleep difficulties, scoliosis, unusually fair skin, and light-colored hair. Children with Angelman syndrome typically have a happy demeanor with frequent laughing, smiling, and excitability. Hyperactivity, a short attention span, and a fascination with water are also common. Features of Angelman syndrome typically manifest after one year of age. The diagnosis of Angelman syndrome can be made based on clinical features alone or through genetic testing.  Many of the characteristic features of Angelman syndrome result from the loss of function of the UBE3A gene. The UBE3A gene is located within a region on chromosome 15 called the Angelman syndrome/Prader-Willi syndrome (AS/PWS) region. Typically, individuals inherit one copy of the UBE3A gene from their mother and one from their father. Both copies of the UBE3A gene are active in many of the body's tissues. However, only the maternally-inherited copy is active in parts of the brain. Parent-specific gene activation is caused by a phenomenon called genomic imprinting. If the maternal copy of the UBE3A gene is lost due to a chromosomal change or a gene mutation, an individual will have no active copies of the gene in some parts of the brain, leading to the features characteristic of Angelman syndrome.  There are several different genetic mechanisms that can inactivate or cause deficient expression of the maternally-inherited copy of the UBE3A gene. Approximately 65-75% of cases of Angelman syndrome occur due to a deletion of a segment of the maternal AS/PWS region containing the UBE3A gene. Approximately 11% of cases are caused by a pathogenic variant in the  maternal copy of the UBE3A gene. In 3-7% of cases, an individual  inherits two paternal copies of chromosome 15 rather than one chromosome copy from each parent in a process called uniparental disomy (UPD). Other rarer causes of Angelman syndrome include imprinting defects due to a genetic change in the region of DNA that controls imprinting of the UBE3A gene (3%) or chromosomal translocations involving the AS/PWS region (1%). The cause of Angelman syndrome is unknown in 10% of affected individuals.   Given that Ms. Rosenbloom's brother has not had genetic testing performed, precise recurrence risk for Ms. Ovitt's children is limited. However, most cases of Angelman syndrome are not inherited and occur in individuals with no family history of the condition. Recurrence risk for siblings of individuals with Angelman syndrome due to a chromosomal deletion or paternal UPD is approximately 1%. However, when Angelman syndrome is caused by a pathogenic variant in the ZES9Q gene, certain imprinting defects, or a chromosomal translocation, recurrence risk to siblings can be as high as 50%. If Ms. Huebert mother carries a deletion in the imprinting center or a pathogenic variant in UBE3A, Ms. Roarty would have had a 50% chance of inheriting that genetic change from her mother. However, given that Ms. Rothgeb is unaffected, it is unlikely that she inherited either of these types of genetic changes from her mother. Thus, the chance that Ms. Savannah would pass on one of these variants and have a child with Angelman syndrome is approximately 1%. Without knowing the precise genetic cause of her brother's Angelman syndrome, prenatal testing options for Angelman syndrome are limited.  Ms. Wann was counseled on aneuploidy screening options that are available during pregnancy. We reviewed noninvasive prenatal screening (NIPS) as an available screening option. Specifically, we discussed that NIPS analyzes cell free DNA originating  from the placenta that is found in the maternal blood circulation during pregnancy. This test is not diagnostic for chromosome conditions, but can provide information regarding the presence or absence of extra fetal DNA for chromosomes 13, 18, 21, and the sex chromosomes. Thus, it would not identify or rule out all fetal aneuploidy. The reported detection rate is 91-99% for trisomies 21, 18, 13, and sex chromosome aneuploidies. The false positive rate is reported to be less than 0.1% for any of these conditions. Microdeletion syndromes can also be added onto NIPS analysis; however, NIPS is only able to screen for Angelman syndrome caused by a deletion on chromosome 15. Ms. Forrester indicated that she is interested in undergoing NIPS; however, she was concerned about insurance coverage for NIPS given that she is considered low-risk. I offered her [facility]'s $99 patient-pay option for NIPS, but she preferred to pursue an aneuploidy screening option that had a better chance of being covered by insurance.   We also discussed quad screening as another aneuploidy screening option available in the second trimester if her insurance company does not cover the cost of NIPS. Instead of utilizing fetal DNA, quad screening measures four proteins in a pregnant woman's blood. The levels of these proteins can help identify if a pregnancy is at high risk for trisomy 28 (Down syndrome), trisomy 18, and open neural tube defects (ONTDs). Quad screening detects 75-80% of cases of Down syndrome, 73% of cases of trisomy 18, and 80-90% of ONTDs. It cannot assess for the risk of trisomy 69, triploidy, sex chromosome aneuploidies, or Angelman syndrome in a pregnancy. Ms. Whitmoyer recalled undergoing quad screening for her previous pregnancies and desired quad screening for the current pregnancy if NIPS is not covered, as she did not have issues with insurance  coverage for this type of aneuploidy screening in the past. Quad screening is  available from 15-22 weeks' gestation. Ms. Remillard opted to undergo quad screening when she [facility] for her anatomy ultrasound in early December if her insurance will not cover the cost of NIPS.  Per ACOG recommendation, carrier screening for hemoglobinopathies, cystic fibrosis (CF) and spinal muscular atrophy (SMA) was also discussed including information about the conditions, rationale for testing, autosomal recessive inheritance, and the option of prenatal diagnosis. Ms. Zeringue had negative hemoglobin electrophoresis, ruling out clinically significant hemoglobinopathies. I offered additional carrier screening for CF and SMA, which Ms. Foskey declined at this time. Without carrier screening to refine risk and based on ethnicity alone, Ms. Anglin risk to be a carrier of CF is 1 in 49 and her risk to be a carrier of SMA is 1 in 60 (Lazarin et al., 2013). She was informed that select hemoglobinopathies and CF are included on [place]'s newborn screen, but that SMA currently is not included. We briefly discussed the Early Check research study to add SMA to her baby's newborn screening panel, but Ms. Covey was not interested in learning more about this.   Finally, Ms. Singley was counseled regarding the option of diagnostic testing via chorionic villus sampling (CVS) or amniocentesis . We discussed the technical aspects of each procedure and quoted up to a 1 in 500 (0.2%) risk for spontaneous pregnancy loss or other adverse pregnancy outcomes as a result of either procedure. Cultured cells from either a placental or amniotic fluid sample allow for the visualization of a fetal karyotype, which can detect >99% of chromosomal aberrations. Chromosomal microarray can also be performed to identify smaller deletions or duplications of fetal chromosomal material. CVS or amniocentesis could also be performed to assess whether the baby is affected by Angelman syndrome if the genetic cause  has been identified in an affected individual. After careful consideration, Ms. Mantia declined diagnostic testing at this time. She understands that diagnostic testing is available at any point through the end of pregnancy and that she may opt to undergo the procedure at a later date should she change her mind.  Ms. Faraci requested to check with her insurance regarding coverage for NIPS prior to getting her blood drawn. I provided her with the CPT billing codes for NIPS. I encouraged Ms. Larmer to contact me when she got in touch with her insurance company and made a final decision about undergoing NIPS vs. quad screening. If she is interested in undergoing NIPS, I will facilitate the sample collection and testing process from there. Otherwise, we will plan on ordering quad screening at the time of her anatomy ultrasound.  I counseled Ms. Hancox regarding the above risks and available options. The approximate face-to-face time with the genetic counselor was 50 minutes.  In summary:  Discussed family history of Angelman syndrome and options for follow-up testing  Recurrence risk is likely low (~1%)  Discussed carrier screening for cystic fibrosis and spinal muscular atrophy  Negative hemoglobin electrophoresis, reducing risk to be carrier of hemoglobinopathy  Declined carrier screening for cystic fibrosis and spinal muscular atrophy  Reviewed aneuploidy screening options  Desires NIPS but wishes to speak to her insurance company about coverage first. I will facilitate testing if carrier screening is approved  NIPS analysis for microdeletion syndromes including the deletion form of Angelman syndrome is available  Desires quad screening at time of anatomy ultrasound if NIPS is not approved  Offered additional testing  and screening  Declined CVS  Reviewed family history concerns   Buelah Manis, MS Genetic Counselor

## 2019-10-20 ENCOUNTER — Encounter: Payer: Self-pay | Admitting: Advanced Practice Midwife

## 2019-10-20 DIAGNOSIS — Z8489 Family history of other specified conditions: Secondary | ICD-10-CM | POA: Insufficient documentation

## 2019-11-01 ENCOUNTER — Telehealth (INDEPENDENT_AMBULATORY_CARE_PROVIDER_SITE_OTHER): Payer: BC Managed Care – PPO | Admitting: Certified Nurse Midwife

## 2019-11-01 ENCOUNTER — Encounter: Payer: Self-pay | Admitting: Certified Nurse Midwife

## 2019-11-01 VITALS — BP 100/57

## 2019-11-01 DIAGNOSIS — Z8489 Family history of other specified conditions: Secondary | ICD-10-CM

## 2019-11-01 DIAGNOSIS — O26899 Other specified pregnancy related conditions, unspecified trimester: Secondary | ICD-10-CM

## 2019-11-01 DIAGNOSIS — R102 Pelvic and perineal pain: Secondary | ICD-10-CM

## 2019-11-01 DIAGNOSIS — Z3A16 16 weeks gestation of pregnancy: Secondary | ICD-10-CM

## 2019-11-01 DIAGNOSIS — O26892 Other specified pregnancy related conditions, second trimester: Secondary | ICD-10-CM

## 2019-11-01 DIAGNOSIS — Z3480 Encounter for supervision of other normal pregnancy, unspecified trimester: Secondary | ICD-10-CM

## 2019-11-01 NOTE — Progress Notes (Signed)
TELEHEALTH OBSTETRICS PRENATAL VIRTUAL VIDEO VISIT ENCOUNTER NOTE  Provider location: [facility] [place]   I connected with Katheran Awe on 11/01/19 at  8:58 AM EST by MyChart Video Encounter at home and verified that I am speaking with the correct person using two identifiers.   I discussed the limitations, risks, security and privacy concerns of performing an evaluation and management service virtually and the availability of in person appointments. I also discussed with the patient that there may be a patient responsible charge related to this service. The patient expressed understanding and agreed to proceed. Subjective:  Kathy Nichols is a 33 y.o. G3P2002 at [redacted]w[redacted]d being seen today for ongoing prenatal care.  She is currently monitored for the following issues for this low-risk pregnancy and has Supervision of other normal pregnancy, antepartum and Family history of genetic disease on their problem list.  Patient reports backache.  Contractions: Not present.  .  Movement: Present. Denies any leaking of fluid.   The following portions of the patient's history were reviewed and updated as appropriate: allergies, current medications, past family history, past medical history, past social history, past surgical history and problem list.   Objective:   Vitals:   11/01/19 0838  BP: (!) 100/57    Fetal Status:     Movement: Present     General:  Alert, oriented and cooperative. Patient is in no acute distress.  Respiratory: Normal respiratory effort, no problems with respiration noted  Mental Status: Normal mood and affect. Normal behavior. Normal judgment and thought content.  Rest of physical exam deferred due to type of encounter  Imaging: No results found.  Assessment and Plan:  Pregnancy: G3P2002 at [redacted]w[redacted]d 1. Supervision of other normal pregnancy, antepartum - Patient doing well, reports nausea/vomiting has subsided and reports energy is back  -  Routine prenatal care  - Anticipatory guidance on upcoming appointments  - Patient reports not eating meat - encouraged patient to eat beans, lentils, cheese to get protein  - Educated and discussed what to expect with fetal movement over the next 4 weeks   2. Family history of genetic disease - Genetic counseling completed on 11/2- no additional follow ups or recommendations  - Follow up as scheduled for complete anatomy on 12/8  3. Pain of round ligament during pregnancy - patient reports occasional back pain in lower spine and occasional pelvic pain with movement, she reports this occurred later in pregnancy with last child  - Educated and discussed round ligament pain during pregnancy and it occurring earlier in subsequent pregnancies \ - Patient has a maternity support belt, encouraged patient to use maternity support belt   Preterm labor symptoms and general obstetric precautions including but not limited to vaginal bleeding, contractions, leaking of fluid and fetal movement were reviewed in detail with the patient. I discussed the assessment and treatment plan with the patient. The patient was provided an opportunity to ask questions and all were answered. The patient agreed with the plan and demonstrated an understanding of the instructions. The patient was advised to call back or seek an in-person office evaluation/go to [facility]U at [facility] for any urgent or concerning symptoms. Please refer to After Visit Summary for other counseling recommendations.   I provided 10 minutes of face-to-face time during this encounter.  Return in about 6 weeks (around 12/13/2019) for ROB-virtual .  Future Appointments  Date Time Provider Department Center  11/15/2019  8:30 AM [facility] [place] 5 [facility]  12/13/2019  8:35 AM Lajean Manes, CNM [facility]    Lajean Manes, [place] [facility] [employer], [place]

## 2019-11-15 ENCOUNTER — Ambulatory Visit (HOSPITAL_COMMUNITY): Payer: BC Managed Care – PPO

## 2019-11-15 ENCOUNTER — Other Ambulatory Visit (HOSPITAL_COMMUNITY): Payer: Self-pay | Admitting: *Deleted

## 2019-11-15 ENCOUNTER — Other Ambulatory Visit: Payer: Self-pay | Admitting: Advanced Practice Midwife

## 2019-11-15 ENCOUNTER — Other Ambulatory Visit: Payer: Self-pay

## 2019-11-15 ENCOUNTER — Ambulatory Visit (HOSPITAL_COMMUNITY)
Admission: RE | Admit: 2019-11-15 | Discharge: 2019-11-15 | Disposition: A | Discharge status: Home / Self Care | Payer: BC Managed Care – PPO | Source: Ambulatory Visit | Attending: Obstetrics and Gynecology | Admitting: Obstetrics and Gynecology

## 2019-11-15 DIAGNOSIS — Z3A18 18 weeks gestation of pregnancy: Secondary | ICD-10-CM | POA: Diagnosis present

## 2019-11-15 DIAGNOSIS — Z36 Encounter for antenatal screening for chromosomal anomalies: Secondary | ICD-10-CM | POA: Insufficient documentation

## 2019-11-15 DIAGNOSIS — Z3480 Encounter for supervision of other normal pregnancy, unspecified trimester: Secondary | ICD-10-CM | POA: Insufficient documentation

## 2019-11-15 DIAGNOSIS — Z363 Encounter for antenatal screening for malformations: Secondary | ICD-10-CM | POA: Insufficient documentation

## 2019-11-15 DIAGNOSIS — O43192 Other malformation of placenta, second trimester: Secondary | ICD-10-CM

## 2019-11-15 DIAGNOSIS — Z8489 Family history of other specified conditions: Secondary | ICD-10-CM

## 2019-11-17 ENCOUNTER — Telehealth (HOSPITAL_COMMUNITY): Payer: Self-pay | Admitting: Genetic Counselor

## 2019-11-17 LAB — AFP TETRA
DIA Mom Value: 1.99
DIA Value (EIA): 315.23 pg/mL
DSR (By Age)    1 IN: 332
DSR (Second Trimester) 1 IN: 82
Gestational Age: 18.3 WEEKS
MSAFP Mom: 0.83
MSAFP: 35.5 ng/mL
MSHCG Mom: 1.46
MSHCG: 37896 m[IU]/mL
Maternal Age At EDD: 34.5 yr
Osb Risk: 10000
T18 (By Age): 1:1292 {titer}
Test Results:: POSITIVE — AB
Weight: 162 [lb_av]
uE3 Mom: 0.66
uE3 Value: 1.03 ng/mL

## 2019-11-17 NOTE — Telephone Encounter (Signed)
Received call back from Ms. Paczkowski to discuss her quad screening results. Ms. Shuffield quad screen demonstrated an increased risk for Down syndrome in the current fetus. The risk for Down syndrome increased from her 1 in 332 (0.3%) age-related risk to 1 in 82 (1.2%). The risk for trisomy 28 in the current fetus was not increased over her 1 in 1292 age-related risk, and the risk for open neural tube defects such as spina bifida was 1 in 10,000.  We discussed that quad screen is not a perfect test. A 1.2% risk for Down syndrome means there is a >98% chance that the baby does not have Down syndrome. We also reviewed that her anatomy ultrasound performed earlier this week did not show any obvious fetal anomalies or markers suggestive of aneuploidy, and that ~50% of fetuses with Down syndrome demonstrate an associated ultrasound finding.  We discussed Ms. Ahlberg's options for getting more information about the pregnancy. We had previously discussed these options in detail during her genetic counseling appointment on 11/2; see Genetic Counseling note for more details. Firstly, she could consider undergoing noninvasive prenatal screening (NIPS). NIPS is more accurate than quad screen, detecting >99% of Down syndrome cases with a less than 0.1% false positive rate. Secondly, Ms. Enriques has the option of undergoing amniocentesis. We reviewed that this is the only way for [place] to definitively determine whether or not a fetus has Down syndrome or another chromosomal abnormality prenatally. I reminded Ms. Myricks that amniocentesis comes with a 1 in 182 risk for complications that could lead to miscarriage.   Ms. Renbarger wished to speak with her husband about their options before making a decision. I encouraged her to contact me once they have made their decision. From there, I can help facilitate testing. She agreed to this plan and confirmed that she had no further questions.   Buelah Manis, MS Genetic Counselor

## 2019-11-18 ENCOUNTER — Telehealth (HOSPITAL_COMMUNITY): Payer: Self-pay | Admitting: Genetic Counselor

## 2019-11-18 NOTE — Telephone Encounter (Signed)
Received a call back from Ms. Chambless indicating that she had spoken with her husband and they decided to pursue noninvasive prenatal screening (NIPS) following her quad screening result that showed an increased risk for Down syndrome. The couple preferred to start with a noninvasive testing option first to avoid risk to the fetus. We reviewed that if NIPS were to come back abnormal, we could have a further discussion about possibly pursuing amniocentesis at that time.   We discussed that insurance would likely cover the cost of NIPS now that Ms. Gao has received an abnormal screening result, but that she could call her insurance company with the CPT codes I had previously provided her to definitively determine insurance coverage. She requested that I send those to her again, so I will follow up with an email. If insurance will not cover testing, we discussed that we have other NIPS options with lower out-of-pocket costs.  Ms. Laverne and I made a plan for her to get in touch with me next week once she has had a chance to contact her insurance company. I will help to coordinate sample collection and testing once I hear back from her. Ms. Consuegra confirmed that she had no further questions at this time.  Buelah Manis, MS Genetic Counselor

## 2019-11-21 ENCOUNTER — Encounter: Payer: Self-pay | Admitting: Advanced Practice Midwife

## 2019-11-21 ENCOUNTER — Telehealth (HOSPITAL_COMMUNITY): Payer: Self-pay | Admitting: Genetic Counselor

## 2019-11-21 ENCOUNTER — Ambulatory Visit (INDEPENDENT_AMBULATORY_CARE_PROVIDER_SITE_OTHER): Payer: BC Managed Care – PPO | Admitting: Obstetrics & Gynecology

## 2019-11-21 ENCOUNTER — Telehealth: Payer: Self-pay

## 2019-11-21 ENCOUNTER — Other Ambulatory Visit: Payer: Self-pay

## 2019-11-21 VITALS — BP 102/62 | HR 78 | Wt 162.0 lb

## 2019-11-21 DIAGNOSIS — O280 Abnormal hematological finding on antenatal screening of mother: Secondary | ICD-10-CM

## 2019-11-21 DIAGNOSIS — Z3A19 19 weeks gestation of pregnancy: Secondary | ICD-10-CM

## 2019-11-21 DIAGNOSIS — M6208 Separation of muscle (nontraumatic), other site: Secondary | ICD-10-CM

## 2019-11-21 DIAGNOSIS — O43192 Other malformation of placenta, second trimester: Secondary | ICD-10-CM | POA: Insufficient documentation

## 2019-11-21 DIAGNOSIS — O99891 Other specified diseases and conditions complicating pregnancy: Secondary | ICD-10-CM

## 2019-11-21 DIAGNOSIS — R350 Frequency of micturition: Secondary | ICD-10-CM

## 2019-11-21 DIAGNOSIS — O285 Abnormal chromosomal and genetic finding on antenatal screening of mother: Secondary | ICD-10-CM | POA: Insufficient documentation

## 2019-11-21 DIAGNOSIS — M549 Dorsalgia, unspecified: Secondary | ICD-10-CM

## 2019-11-21 LAB — POCT URINALYSIS DIPSTICK
Bilirubin, UA: NEGATIVE
Blood, UA: NEGATIVE
Glucose, UA: NEGATIVE
Ketones, UA: NEGATIVE
Leukocytes, UA: NEGATIVE
Nitrite, UA: NEGATIVE
Protein, UA: NEGATIVE
Spec Grav, UA: 1.025 (ref 1.010–1.025)
Urobilinogen, UA: 0.2 E.U./dL
pH, UA: 5 (ref 5.0–8.0)

## 2019-11-21 NOTE — Telephone Encounter (Signed)
Pt states that she is leaking some kind of fluid and is unsure if it is urine. Pt states the fluid has no odor. Pt complains of back aches and feels as if she has a UTI. Pt encouraged to come be seen in the office today. Pt wanted to wait until tomorrow but I told her she needs to be evaluated today. Pt expressed understanding and appt has been made for today.

## 2019-11-21 NOTE — Telephone Encounter (Signed)
Returned Ms. Funches's call re: noninvasive prenatal screening (NIPS). She called her insurance company who indicated that testing would be covered, but that she would still owe a 20% copay. Since we cannot guarantee how much this would cost, we reviewed the option of ordering NIPS through [facility], as they have a $99 patient pay option that patients may select if out-of-pocket cost is expected to be >$100 per the laboratory's benefits investigation. Ms. Lievanos [facility] between 1:15-3 pm to have her blood drawn for [facility] NIPS. She was also interested in adding on testing for microdeletion syndromes, including Angelman syndrome. Results from testing will take 5-7 days to be returned. I will call Ms. Manganaro when results become available. She confirmed that she had no further questions at this time.  Buelah Manis, MS Genetic Counselor

## 2019-11-21 NOTE — Progress Notes (Signed)
Pt c/o frequent urination and back pain    PRENATAL VISIT NOTE  Subjective:  Kathy Nichols is a 33 y.o. G3P2002 at [redacted]w[redacted]d being seen today for ongoing prenatal care.  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  Patient reports back pain, lower abdominal pain, leakage of urine..  Contractions: Not present. Vag. Bleeding: None.  Movement: Present. Denies leaking of fluid.   The following portions of the patient's history were reviewed and updated as appropriate: allergies, current medications, past family history, past medical history, past social history, past surgical history and problem list.   Objective:   Vitals:   11/21/19 1615  BP: 102/62  Pulse: 78  Weight: 162 lb (73.5 kg)    Fetal Status: Fetal Heart Rate (bpm): 147   Movement: Present     General:  Alert, oriented and cooperative. Patient is in no acute distress.  Skin: Skin is warm and dry. No rash noted.   Cardiovascular: Normal heart rate noted  Respiratory: Normal respiratory effort, no problems with respiration noted  Abdomen: Soft, gravid, appropriate for gestational age.  Pain/Pressure: Present     Pelvic: Cervical exam performed       closed, long, high & no discharge in vagina.  nitrizine and fern negative  Extremities: Normal range of motion.  Edema: None  Mental Status: Normal mood and affect. Normal behavior. Normal judgment and thought content.   Assessment and Plan:  Pregnancy: G3P2002 at [redacted]w[redacted]d 1. Frequent urination - POCT Urinalysis Dipstick - Culture, OB Urine - No evidence of ROM  2. Back pain affecting pregnancy, antepartum - PT for back pain - POCT Urinalysis Dipstick - Culture, OB  Urine  3. Abnormal quad screen Pt going to MFM and getting NIPS and Angelman test.  Hillary spoke with patient.  4.  Weight Pt has not gained weight.  Suggest Ensure shakes if she doesn't feel like eating.  Weight in Baby Rx does not match what is in APP.  [employer] contacted.    Preterm labor symptoms and general obstetric precautions including but not limited to vaginal bleeding, contractions, leaking of fluid and fetal movement were reviewed in detail with the patient. Please refer to After Visit Summary for other counseling recommendations.   No follow-ups on file.  Future Appointments  Date Time Provider [place]  12/13/2019  8:35 AM Lajean Manes CNM [facility]  12/13/2019 10:45 AM [place] 2 [facility]  12/13/2019 11:55 AM [facility] NURSE [facility]    Silas [place], MD

## 2019-11-22 ENCOUNTER — Ambulatory Visit (HOSPITAL_COMMUNITY): Payer: BC Managed Care – PPO | Attending: Obstetrics

## 2019-11-22 DIAGNOSIS — O285 Abnormal chromosomal and genetic finding on antenatal screening of mother: Secondary | ICD-10-CM

## 2019-11-22 DIAGNOSIS — Z8279 Family history of other congenital malformations, deformations and chromosomal abnormalities: Secondary | ICD-10-CM | POA: Insufficient documentation

## 2019-11-22 DIAGNOSIS — Z3A00 Weeks of gestation of pregnancy not specified: Secondary | ICD-10-CM | POA: Insufficient documentation

## 2019-11-24 LAB — URINE CULTURE, OB REFLEX: Organism ID, Bacteria: NO GROWTH

## 2019-11-24 LAB — CULTURE, OB URINE

## 2019-11-29 ENCOUNTER — Other Ambulatory Visit (HOSPITAL_COMMUNITY): Payer: Self-pay | Admitting: Obstetrics and Gynecology

## 2019-11-29 ENCOUNTER — Telehealth (HOSPITAL_COMMUNITY): Payer: Self-pay | Admitting: Genetic Counselor

## 2019-11-29 NOTE — Telephone Encounter (Signed)
I called Ms. Cocuzza to discuss her negative noninvasive prenatal screening (NIPS)/cell free DNA (cfDNA) testing result. Specifically, Ms. Heckert  had NIPS testing through [facility]. Testing was offered because of quad screening that demonstrated an increased risk for trisomy 21 AKA Down syndrome (1 in 86) for the current pregnancy. These negative results demonstrated an expected representation of chromosome 21, 62, 80, and sex chromosome material, greatly reducing the likelihood of trisomies 84, 65, or 31 and sex chromosome aneuploidies for the pregnancy. Ms. Rhine also opted to have analysis for 5 clinical microdeletion syndrome regions, including the region on chromosome 15 that can be deleted in Angelman syndrome. Results from microdeletion syndrome analysis was also negative. However, NIPS does not assess for all possible causes of Angelman syndrome.  NIPS analyzes placental (fetal) DNA in maternal circulation. NIPS is considered to be highly specific and sensitive, but is not considered to be a diagnostic test. We reviewed that this testing identifies 98-99% of pregnancies with trisomies 34, 17, and 76, as well as sex chromosome aneuploidies. Ms. Bost was reminded that diagnostic testing via amniocentesis is available should she be interested in confirming this result. She confirmed that she had no questions about these results at this time.  Buelah Manis, MS Genetic Counselor

## 2019-12-09 NOTE — L&D Delivery Note (Signed)
OB/GYN Faculty Practice Delivery Note  Kathy Nichols is a 34 y.o. G3P2002 s/p NSVD at [redacted]w[redacted]d. She was admitted for active labor.   ROM: 0h 75m with clear fluid GBS Status: negative Maximum Maternal Temperature: 97.8*F  Labor Progress: . Admitted at 10 cm. SROM upon transfer to labor bed  Delivery Date/Time: 04/07/20, 0730 Delivery: Called to room and patient was complete and pushing. Head delivered LOA with bilateral compound hands. No nuchal cord present. Shoulder and body delivered in usual fashion. Infant with spontaneous cry, placed on mother's abdomen, dried and stimulated. Cord clamped x 2 after 1-minute delay, and cut. Cord blood drawn. Placenta delivered spontaneously with gentle cord traction. Fundus firm with massage and Pitocin. Labia, perineum, vagina, and cervix were inspected, and a 2nd degree laceration was noted- repaired with monocryl in the usual fashion.   Placenta: 3 vessel cord, intact, to L&D Complications: Precipitous labor Lacerations: 2nd degree laceration was noted- repaired with monocryl in the usual fashion.  EBL: 112 mL Analgesia: 1% lidocaine for vaginal repair  Postpartum Planning [x]  message to sent to schedule follow-up  [x]  vaccines UTD  Infant: female  APGARs 9, 9  weight per medical record  , DO OB/GYN Fellow, Faculty Practice

## 2019-12-13 ENCOUNTER — Ambulatory Visit (HOSPITAL_COMMUNITY): Payer: BC Managed Care – PPO | Admitting: *Deleted

## 2019-12-13 ENCOUNTER — Ambulatory Visit (HOSPITAL_COMMUNITY)
Admission: RE | Admit: 2019-12-13 | Discharge: 2019-12-13 | Disposition: A | Discharge status: Home / Self Care | Payer: BC Managed Care – PPO | Source: Ambulatory Visit | Attending: Obstetrics and Gynecology | Admitting: Obstetrics and Gynecology

## 2019-12-13 ENCOUNTER — Other Ambulatory Visit (HOSPITAL_COMMUNITY): Payer: Self-pay | Admitting: *Deleted

## 2019-12-13 ENCOUNTER — Telehealth (INDEPENDENT_AMBULATORY_CARE_PROVIDER_SITE_OTHER): Payer: BC Managed Care – PPO | Admitting: Certified Nurse Midwife

## 2019-12-13 ENCOUNTER — Ambulatory Visit (HOSPITAL_BASED_OUTPATIENT_CLINIC_OR_DEPARTMENT_OTHER): Payer: BC Managed Care – PPO | Admitting: Maternal & Fetal Medicine

## 2019-12-13 ENCOUNTER — Encounter: Payer: Self-pay | Admitting: Certified Nurse Midwife

## 2019-12-13 ENCOUNTER — Encounter (HOSPITAL_COMMUNITY): Payer: Self-pay

## 2019-12-13 ENCOUNTER — Other Ambulatory Visit: Payer: Self-pay

## 2019-12-13 VITALS — Wt 167.0 lb

## 2019-12-13 DIAGNOSIS — O35BXX0 Maternal care for other (suspected) fetal abnormality and damage, fetal cardiac anomalies, not applicable or unspecified: Secondary | ICD-10-CM

## 2019-12-13 DIAGNOSIS — O358XX0 Maternal care for other (suspected) fetal abnormality and damage, not applicable or unspecified: Secondary | ICD-10-CM | POA: Insufficient documentation

## 2019-12-13 DIAGNOSIS — Z3A23 23 weeks gestation of pregnancy: Secondary | ICD-10-CM

## 2019-12-13 DIAGNOSIS — Z3A22 22 weeks gestation of pregnancy: Secondary | ICD-10-CM | POA: Diagnosis not present

## 2019-12-13 DIAGNOSIS — Z3480 Encounter for supervision of other normal pregnancy, unspecified trimester: Secondary | ICD-10-CM

## 2019-12-13 DIAGNOSIS — O43192 Other malformation of placenta, second trimester: Secondary | ICD-10-CM

## 2019-12-13 DIAGNOSIS — O285 Abnormal chromosomal and genetic finding on antenatal screening of mother: Secondary | ICD-10-CM

## 2019-12-13 DIAGNOSIS — Z362 Encounter for other antenatal screening follow-up: Secondary | ICD-10-CM

## 2019-12-13 DIAGNOSIS — O43199 Other malformation of placenta, unspecified trimester: Secondary | ICD-10-CM

## 2019-12-13 NOTE — Progress Notes (Signed)
MFM consultation  Date of service 12/13/2019 Reason for consultation: New finding of possible Ventricular septal defect  Today we observed a ventricular septal defect in the apical views but not consistently seen the subcostal views. I reviewed today's imaging with Ms. Suchocki and discussed the findings and implications. I discussed that the defect appeared small <1 cm. However, there was repeated color flow connecting the right and left ventricle. There was some mile flashing in the subcostal views but again this was not consistently seen. I reviewed that VSD are a common finding. They can be associated with aneuploidy and other genetic conditions. I reviewed that this was an isolated finding as I did not observe extracardiac malformations. Ms. Kueker was counseled regarding genetic screening options and diagnostic testing. She has a low risk NIPS after a subsequent increased risk Quad screen and therefore declined additional testing at this time but would like to have a fetal echocardiogram and discuss further testing options after that examination.  I spent 15 minutes with > 50% in face to face consultation  All questions answered.  Pasquale Matters Unk Lightning

## 2019-12-13 NOTE — Progress Notes (Signed)
TELEHEALTH OBSTETRICS PRENATAL VIRTUAL VIDEO VISIT ENCOUNTER NOTE  [facility] [place]   I connected with Kathy Nichols on 12/13/19 at  8:35 AM EST by MyChart Video Encounter at home and verified that I am speaking with the correct person using two identifiers.   I discussed the limitations, risks, security and privacy concerns of performing an evaluation and management service virtually and the availability of in person appointments. I also discussed with the patient that there may be a patient responsible charge related to this service. The patient expressed understanding and agreed to proceed. Subjective:  Kathy Nichols is a 34 y.o. G3P2002 at 1742w3d being seen today for ongoing prenatal care.  She is currently monitored for the following issues for this low-risk pregnancy and has Supervision of other normal pregnancy, antepartum; Family history of genetic disease; Marginal insertion of umbilical cord affecting management of mother in second trimester; Abnormal chromosomal and genetic finding on antenatal screening mother; and Rectus diastasis on their problem list.  Patient reports backache.  Contractions: Not present.  .  Movement: Present. Denies any leaking of fluid.   The following portions of the patient's history were reviewed and updated as appropriate: allergies, current medications, past family history, past medical history, past social history, past surgical history and problem list.   Objective:   Vitals:   12/13/19 0854  Weight: 167 lb (75.8 kg)    Fetal Status:     Movement: Present     General:  Alert, oriented and cooperative. Patient is in no acute distress.  Respiratory: Normal respiratory effort, no problems with respiration noted  Mental Status: Normal mood and affect. Normal behavior. Normal judgment and thought content.  Rest of physical exam deferred due to type of encounter  Imaging: US MFM OB DETAIL +14  WK  Result Date: 11/15/2019 ----------------------------------------------------------------------  OBSTETRICS REPORT                       (Signed Final 11/15/2019 10:00 am) ---------------------------------------------------------------------- Patient Info  ID #:       [id number]                          D.O.B.:  1986/02/26 (33 yrs)  Name:       Kathy Nichols                    Visit Date: 11/15/2019 08:38 am ---------------------------------------------------------------------- Performed By  Performed By:     Emeline DarlingKasie E Kiser BS,      [facility]   [facility]                    RDMS  Attending:        Ma RingsVictor Fang MD         Address:          [phone]                                                             [place], [place]  Referred By:      Dorathy KinsmanVIRGINIA SMITH         Location:         [facility]                                      [facility]  Ref. Address:     [address]                    [place]                    [address] ---------------------------------------------------------------------- Orders   #  Description                          Code         Ordered By   1  [place] MFM OB DETAIL +14 WK              76811.01     Dorathy Kinsman  ----------------------------------------------------------------------   #  Order #                    Accession #                 Episode #   1  [id number]                  [id number]                  [id number]  ---------------------------------------------------------------------- Indications   Marginal insertion of umbilical cord affecting O43.192   management of mother in second trimester   Encounter for antenatal screening for          Z36.3   malformations   Family history of genetic disorder             Z84.89   [redacted] weeks gestation of pregnancy                Z3A.18  ---------------------------------------------------------------------- Fetal Evaluation  Num Of Fetuses:         1  Fetal Heart  Rate(bpm):  146  Cardiac Activity:       Observed  Presentation:           Cephalic  Placenta:               Anterior  P. Cord Insertion:      Marginal insertion  Amniotic Fluid  AFI FV:      Within normal limits                              Largest Pocket(cm)                              5.4 ---------------------------------------------------------------------- Biometry  BPD:      42.2  mm     G. Age:  18w 5d         66  %    CI:        77.63   %    70 - 86  FL/HC:      18.1   %    15.8 - 18  HC:      151.6  mm     G. Age:  18w 1d         30  %    HC/AC:      1.11        1.07 - 1.29  AC:      136.9  mm     G. Age:  19w 1d         70  %    FL/BPD:     65.2   %  FL:       27.5  mm     G. Age:  18w 3d         23  %    FL/AC:      20.1   %    20 - 24  HUM:      27.7  mm     G. Age:  18w 6d         66  %  NFT:       3.9  mm  Est. FW:     254  gm      0 lb 9 oz     64  % ---------------------------------------------------------------------- OB History  Gravidity:    3         Term:   2        Prem:   0        SAB:   0  TOP:          0       Ectopic:  0        Living: 2 ---------------------------------------------------------------------- Gestational Age  LMP:           18w 3d        Date:  07/09/19                 EDD:   04/14/20  U/S Today:     18w 4d                                        EDD:   04/13/20  Best:          18w 3d     Det. By:  LMP  (07/09/19)          EDD:   04/14/20 ---------------------------------------------------------------------- Anatomy  Cranium:               Appears normal         LVOT:                   Appears normal  Cavum:                 Appears normal         Aortic Arch:            Appears normal  Ventricles:            Appears normal         Ductal Arch:            Appears normal  Choroid Plexus:        Appears normal         Diaphragm:              Appears normal  Cerebellum:            Appears normal         Stomach:                 Appears normal, left                                                                        sided  Posterior Fossa:       Appears normal         Abdomen:                Appears normal  Nuchal Fold:           Appears normal         Abdominal Wall:         Appears nml (cord                                                                        insert, abd wall)  Face:                  Appears normal         Cord Vessels:           Appears normal (3                         (orbits and profile)                           vessel cord)  Lips:                  Appears normal         Kidneys:                Appear normal  Palate:                Appears normal         Bladder:                Appears normal  Thoracic:              Appears normal         Spine:                  Appears normal  Heart:                 Appears normal         Upper Extremities:      Appears normal                         (4CH, axis, and                         situs)  RVOT:  Appears normal         Lower Extremities:      Appears normal  Other:  Heels visualized. Technically difficult due to fetal position. ---------------------------------------------------------------------- Cervix Uterus Adnexa  Cervix  Length:            3.7  cm.  Normal appearance by transabdominal scan. ---------------------------------------------------------------------- Comments  This patient was seen for a detailed fetal anatomy scan. She  denies any significant past medical history and denies any  problems in her current pregnancy.  The patient has not had any screening tests for fetal  aneuploidy drawn in her current pregnancy.  She is  scheduled to have a quad screen drawn following today's  ultrasound appointment.  She was informed that the fetal growth and amniotic fluid  level were appropriate for her gestational age.  There were no obvious fetal anomalies noted on today's  ultrasound exam.  However, today's exam was limited due to  the fetal  position.  The patient was informed that anomalies may be missed due  to technical limitations. If the fetus is in a suboptimal position  or maternal habitus is increased, visualization of the fetus in  the maternal uterus may be impaired.  A possible marginal placental cord insertion was noted today.  The increased risk of fetal growth issues later in her  pregnancy due to the marginal placental cord insertion was  discussed.  Due to this finding, we will continue to follow her  with serial growth ultrasounds.  A follow-up exam was scheduled in 4 weeks to obtain better  views of the fetal anatomy and to assess the fetal growth. ----------------------------------------------------------------------                   Ma Rings, MD Electronically Signed Final Report   11/15/2019 10:00 am ----------------------------------------------------------------------   Assessment and Plan:  Pregnancy: [id number] at [redacted]w[redacted]d 1. Supervision of other normal pregnancy, antepartum - Patient doing well, reports continued back pain - starting physical therapy tomorrow, reports maternity support belt has cut pain in half  - Patient reports difficulty sleeping for the past week - will put kids to bed and get sleepy but unable to fall asleep.  - Educated and discussed use of magnesium (250mg ) nightly 30-40 minutes prior to bed, patient verbalizes understanding  - Routine prenatal appointment  - Anticipatory guidance on upcoming appointment with GTT at next visit - educated and discussed to fast after MN prior to appointment, patient verbalizes understanding.   2. Marginal insertion of umbilical cord affecting management of mother in second trimester - F/u scheduled for later today to assess fetal growth   3. Abnormal chromosomal and genetic finding on antenatal screening mother - F/u at MFM with NIPS and angelman test is negative - results scanned on 12/15  Preterm labor symptoms and general obstetric precautions  including but not limited to vaginal bleeding, contractions, leaking of fluid and fetal movement were reviewed in detail with the patient. I discussed the assessment and treatment plan with the patient. The patient was provided an opportunity to ask questions and all were answered. The patient agreed with the plan and demonstrated an understanding of the instructions. The patient was advised to call back or seek an in-person office evaluation/go to [facility] at [facility] for any urgent or concerning symptoms. Please refer to After Visit Summary for other counseling recommendations.   I provided 11 minutes of face-to-face time during this encounter.  Return in about 5 weeks (around  01/17/2020) for ROB.  Future Appointments  Date Time Provider Department Center  12/13/2019 10:45 AM [facility] NURSE [facility]  12/13/2019 10:45 AM [facility] [place] 2 WH-MFCUS [facility]  12/14/2019 12:30 PM Theressa Millard, PT [facility]  01/16/2020  8:15 AM Penne Lash, Fredrich Romans, MD [facility]    Sharyon Cable, CNM [facility] [facility]

## 2019-12-14 ENCOUNTER — Other Ambulatory Visit: Payer: Self-pay

## 2019-12-14 ENCOUNTER — Ambulatory Visit: Payer: BC Managed Care – PPO | Attending: Obstetrics & Gynecology | Admitting: Physical Therapy

## 2019-12-14 ENCOUNTER — Encounter: Payer: Self-pay | Admitting: Physical Therapy

## 2019-12-14 DIAGNOSIS — M62830 Muscle spasm of back: Secondary | ICD-10-CM

## 2019-12-14 DIAGNOSIS — M6281 Muscle weakness (generalized): Secondary | ICD-10-CM | POA: Insufficient documentation

## 2019-12-14 DIAGNOSIS — M545 Low back pain, unspecified: Secondary | ICD-10-CM

## 2019-12-14 NOTE — Therapy (Signed)
[facility] [address] [place], [place], [address] Phone: [phone]   Fax:  [phone]  Physical Therapy Evaluation  Patient Details  Name: Kathy Nichols MRN: [id number] Date of Birth: [birth year] Referring Provider (PT): Dr. Elsie Lincoln   Encounter Date: 12/14/2019  PT End of Session - 12/14/19 1317    Visit Number  1    Date for PT Re-Evaluation  02/08/20    Authorization Type  BCBS    PT Start Time  1230    PT Stop Time  1310    PT Time Calculation (min)  40 min    Activity Tolerance  Patient tolerated treatment well;No increased pain    Behavior During Therapy  WFL for tasks assessed/performed       Past Medical History:  Diagnosis Date  . Migraine     Past Surgical History:  Procedure Laterality Date  . TONSILLECTOMY    . WISDOM TOOTH EXTRACTION      There were no vitals filed for this visit.   Subjective Assessment - 12/14/19 1237    Subjective  Back pain started end of first trimester, 11/20. Patient had back pain with second child in third trimester. I wore a belt and it helped last time. Patient has a large diastasis from my second child.    Patient Stated Goals  reduce pain    Currently in Pain?  Yes    Pain Score  7     Pain Location  Back    Pain Orientation  Right    Pain Descriptors / Indicators  Discomfort;Radiating;Cramping    Pain Type  Acute pain    Pain Radiating Towards  right buttocks    Pain Onset  More than a month ago    Pain Frequency  Intermittent    Aggravating Factors   standing for 3 hours while attending to her children, 46 and 29 year old    Pain Relieving Factors  massage, sit down, heat pad    Multiple Pain Sites  No         [facility] PT Assessment - 12/14/19 0001      Assessment   Medical Diagnosis  099.891, M54.9 back pain affecting pregnancy, anterpartum    Referring Provider (PT)  Dr. Elsie Lincoln    Onset Date/Surgical Date  10/09/19    Prior Therapy   none      Precautions   Precautions  Other (comment)    Precaution Comments  second trimester; due 04/14/2020      Restrictions   Weight Bearing Restrictions  No      Balance Screen   Has the patient fallen in the past 6 months  No    Has the patient had a decrease in activity level because of a fear of falling?   No    Is the patient reluctant to leave their home because of a fear of falling?   No      Home Public house manager residence      Prior Function   Level of Independence  Independent    Vocation  Works at home    Leisure  stopped due to [employer]   Overall Cognitive Status  Within Functional Limits for tasks assessed      Observation/Other Assessments   Focus on Therapeutic Outcomes (FOTO)   52% limitation; goal is 33% limitation      Posture/Postural Control   Posture/Postural  Control  No significant limitations      ROM / Strength   AROM / PROM / Strength  AROM;PROM;Strength      AROM   Lumbar Flexion  decreased by 25% with soreness    Lumbar Extension  decreased by 25% with soreness    Lumbar - Right Side Bend  decreased by 25% with soreness    Lumbar - Left Side Bend  decreased by 25% with soreness    Lumbar - Right Rotation  decreased by 25% with soreness    Lumbar - Left Rotation  decreased by 25% wiht soreness      Strength   Right Hip Extension  4/5    Right Hip ABduction  3+/5    Left Hip ABduction  3+/5      Palpation   SI assessment   right ilium is rotated posteriorly    Palpation comment  palpable tenderness located on the right lower lumbar, and SI joint                Objective measurements completed on examination: See above findings.    Pelvic Floor Special Questions - 12/14/19 0001    Diastasis Recti  4 fingers width       [facility] Adult PT Treatment/Exercise - 12/14/19 0001      Manual Therapy   Manual Therapy  Joint mobilization;Muscle Energy Technique    Joint Mobilization  gapping of  the right side of T10-L5    Muscle Energy Technique  correct right ilium             PT Education - 12/14/19 1310    Education Details  education on pelvic muscle energy to correct right ilium and information on belly band    Person(s) Educated  Patient    Methods  Explanation;Demonstration    Comprehension  Verbalized understanding;Returned demonstration       PT Short Term Goals - 12/14/19 1356      PT SHORT TERM GOAL #1   Title  independent with initial HEP    Time  4    Period  Weeks    Status  New    Target Date  01/11/20      PT SHORT TERM GOAL #2   Title  pain in lumbar and right buttocks decreased >/= 25%    Time  4    Period  Weeks    Status  New    Target Date  01/11/20      PT SHORT TERM GOAL #3   Title  looked into a belly band to brace her abdomen due to her diastasis recti    Time  4    Period  Weeks    Status  New    Target Date  01/11/20        PT Long Term Goals - 12/14/19 1357      PT LONG TERM GOAL #1   Title  independent with advanced HEP to support her abdomen and back    Time  8    Period  Weeks    Status  New    Target Date  02/08/20      PT LONG TERM GOAL #2   Title  lumbar and right buttocks pain decreased </= 1/10 due to pelvis in correct alignment and lumbar mobiity    Time  8    Period  Weeks    Status  New    Target Date  02/08/20  PT LONG TERM GOAL #3   Title  demonstrate correct body mechanics to reduce strain on lumbar and diastasis recti while being pregnant    Time  8    Period  Weeks    Status  New    Target Date  02/08/20      PT LONG TERM GOAL #4   Title  FOTO score </= 33% limitation    Time  8    Period  Weeks    Status  New    Target Date  02/08/20      PT LONG TERM GOAL #5   Title  stand to cook a full dinner for the family with pain </= 1-2/10 due to improved back and pelvic stability    Time  8    Period  Weeks    Status  New    Target Date  02/08/20             Plan - 12/14/19  1318    Clinical Impression Statement  Patient is a 34 year old female with acute onset of back pain on 10/2019. Patient is in her second trimester. Patient reports her back pain is 7/10 intermittently. Patients pain is in the right low back and radiates into the right buttocks. Patient right ilium is rotated anteriorly and decreased movement of the right T10 - L5 facets. Patient lumbar ROM is limited by 25% with soreness and reduced lumbar lordosis. Patient has palpable tenderness located in the right lumbar and sacral area. Patient has weakness in bilateral hip abductors and right hip extensors. Patient has a 4 finger width diastasis recti. Patient will benefit from skilled therapy to work on spinal mobility and correct pelvis to reduce pain and improve lumbar strength while managing her diastasis recti.    Personal Factors and Comorbidities  Comorbidity 1    Comorbidities  second trimester    Examination-Activity Limitations  Locomotion Level;Bed Mobility;Bend;Caring for Others;Continence;Stand;Stairs    Examination-Participation Restrictions  Cleaning;Meal Prep;Community Activity;Shop    Stability/Clinical Decision Making  Evolving/Moderate complexity    Clinical Decision Making  Moderate    Rehab Potential  Excellent    PT Frequency  1x / week    PT Duration  8 weeks    PT Treatment/Interventions  ADLs/Self Care Home Management;Cryotherapy;Moist Heat;Neuromuscular re-education;Therapeutic exercise;Therapeutic activities;Functional mobility training;Patient/family education;Manual techniques;Taping;Spinal Manipulations    PT Next Visit Plan  see if patient has gotten the belly band, correct pelvis, posture with standing and home tasks, core strength; ways to manage the diastasis recti    Consulted and Agree with Plan of Care  Patient       Patient will benefit from skilled therapeutic intervention in order to improve the following deficits and impairments:  Decreased range of motion, Increased  fascial restricitons, Increased muscle spasms, Decreased activity tolerance, Pain, Decreased strength, Decreased mobility  Visit Diagnosis: Acute low back pain without sciatica, unspecified back pain laterality - Plan: PT plan of care cert/re-cert  Muscle weakness (generalized) - Plan: PT plan of care cert/re-cert  Muscle spasm of back - Plan: PT plan of care cert/re-cert     Problem List Patient Active Problem List   Diagnosis Date Noted  . 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 Abnormal chromosomal and genetic finding on antenatal screening mother 11/21/2019  . 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 Family history of genetic disease 10/20/2019  . Supervision of other normal pregnancy, antepartum 09/20/2019    Eulis Foster,  PT 12/14/19 2:03 PM    [facility] [address], [address] [place], [place], [address] Phone: [phone]   Fax:  [phone]  Name: Kathy Nichols MRN: [id number] Date of Birth: 1986-07-29

## 2019-12-16 ENCOUNTER — Encounter: Payer: Self-pay | Admitting: Advanced Practice Midwife

## 2019-12-16 DIAGNOSIS — O359XX0 Maternal care for (suspected) fetal abnormality and damage, unspecified, not applicable or unspecified: Secondary | ICD-10-CM | POA: Insufficient documentation

## 2019-12-22 ENCOUNTER — Other Ambulatory Visit: Payer: Self-pay

## 2019-12-22 ENCOUNTER — Encounter: Payer: Self-pay | Admitting: Physical Therapy

## 2019-12-22 ENCOUNTER — Ambulatory Visit: Payer: BC Managed Care – PPO | Admitting: Physical Therapy

## 2019-12-22 DIAGNOSIS — M545 Low back pain, unspecified: Secondary | ICD-10-CM

## 2019-12-22 DIAGNOSIS — M62830 Muscle spasm of back: Secondary | ICD-10-CM

## 2019-12-22 DIAGNOSIS — M6281 Muscle weakness (generalized): Secondary | ICD-10-CM

## 2019-12-22 NOTE — Patient Instructions (Signed)
Access Code: [email or web address]  URL: [email or web address]  Date: 12/22/2019  Prepared by: Eulis Foster   Exercises Cat-Camel - 10 reps - 1 sets - 1x daily - 7x weekly Child's Pose Stretch - 1 reps - 1 sets - 30 sec hold - 1x daily - 7x weekly Seated Piriformis Stretch with Trunk Bend - 1 reps - 1 sets - 30 sec hold - 1x daily - 7x weekly Seated Hamstring Stretch - 1 reps - 1 sets - 30 sec hold - 1x daily - 7x weekly Seated Hip Flexor Stretch - 1 reps - 1 sets - 30 sec hold - 1x daily - 7x weekly Seated Lateral Trunk Stretch on Swiss Ball - 1 reps - 1 sets - 15 sec hold - 1x daily - 7x weekly [facility] [address], [address] [place], [place] [address] Phone # [phone] Fax [phone]

## 2019-12-22 NOTE — Therapy (Signed)
[facility] [address] [place], [place], [address] Phone: [phone]   Fax:  [phone]  Physical Therapy Treatment  Patient Details  Name: Kathy Nichols MRN: [id number] Date of Birth: November 08, 1986 Referring Provider (PT): Dr. Silas Sacramento   Encounter Date: 12/22/2019  PT End of Session - 12/22/19 1022    Visit Number  2    Authorization Type  BCBS    PT Start Time  1020    PT Stop Time  1100    PT Time Calculation (min)  40 min    Activity Tolerance  Patient tolerated treatment well;No increased pain    Behavior During Therapy  WFL for tasks assessed/performed       Past Medical History:  Diagnosis Date  . Migraine     Past Surgical History:  Procedure Laterality Date  . TONSILLECTOMY    . WISDOM TOOTH EXTRACTION      There were no vitals filed for this visit.  Subjective Assessment - 12/22/19 1023    Subjective  My pain is not as intense and not as piercing. I am able to get out of a chair. I can wake up with less pain. sitting on the floor hurts more. I have not had to use a heating pad lately. NO right buttocks pain. Has not had a chance to look into the belly band.    Patient Stated Goals  reduce pain    Currently in Pain?  Yes    Pain Score  5     Pain Location  Back    Pain Orientation  Right    Pain Descriptors / Indicators  Discomfort;Radiating;Cramping    Pain Type  Acute pain    Pain Radiating Towards  no right buttocks pain    Pain Onset  More than a month ago    Pain Frequency  Intermittent    Aggravating Factors   standing for 3 hours whiel attending to her children, 64 and 1 years old    Pain Relieving Factors  massage, sit down    Multiple Pain Sites  No                       [facility] Adult PT Treatment/Exercise - 12/22/19 0001      Therapeutic Activites    Therapeutic Activities  ADL's    ADL's  laying on side using pillows to keep spinal neutral      Lumbar  Exercises: Stretches   Active Hamstring Stretch  Right;Left;1 rep;30 seconds    Active Hamstring Stretch Limitations  sitting    Hip Flexor Stretch  Right;Left;1 rep;30 seconds    Hip Flexor Stretch Limitations  sitting    Quadruped Mid Back Stretch  60 seconds;1 rep    Piriformis Stretch  Right;Left;1 rep;30 seconds    Other Lumbar Stretch Exercise  sitting lateral trunk stretch       Lumbar Exercises: Quadruped   Madcat/Old Horse  15 reps    Other Quadruped Lumbar Exercises  pelvic sway 10x       Manual Therapy   Manual Therapy  Soft tissue mobilization    Soft tissue mobilization  right quadratus, lumbar paraspinals, right gluteals in left sidely             PT Education - 12/22/19 1057    Education Details  Access Code: [email or web address]; ways to sleep on side    Person(s) Educated  Patient    Methods  Explanation;Demonstration;Verbal  cues;Handout    Comprehension  Returned demonstration;Verbalized understanding       PT Short Term Goals - 12/22/19 1101      PT SHORT TERM GOAL #1   Title  independent with initial HEP    Period  Weeks    Status  On-going      PT SHORT TERM GOAL #2   Title  pain in lumbar and right buttocks decreased >/= 25%    Time  4    Period  Weeks    Status  Achieved      PT SHORT TERM GOAL #3   Title  looked into a belly band to brace her abdomen due to her diastasis recti    Time  4    Period  Weeks    Status  On-going        PT Long Term Goals - 12/14/19 1357      PT LONG TERM GOAL #1   Title  independent with advanced HEP to support her abdomen and back    Time  8    Period  Weeks    Status  New    Target Date  02/08/20      PT LONG TERM GOAL #2   Title  lumbar and right buttocks pain decreased </= 1/10 due to pelvis in correct alignment and lumbar mobiity    Time  8    Period  Weeks    Status  New    Target Date  02/08/20      PT LONG TERM GOAL #3   Title  demonstrate correct body mechanics to reduce strain on lumbar and  diastasis recti while being pregnant    Time  8    Period  Weeks    Status  New    Target Date  02/08/20      PT LONG TERM GOAL #4   Title  FOTO score </= 33% limitation    Time  8    Period  Weeks    Status  New    Target Date  02/08/20      PT LONG TERM GOAL #5   Title  stand to cook a full dinner for the family with pain </= 1-2/10 due to improved back and pelvic stability    Time  8    Period  Weeks    Status  New    Target Date  02/08/20            Plan - 12/22/19 1027    Clinical Impression Statement  Patient has less lumbar pain on the right and no buttocks pain in the right. Patient has a trigger point in the left quadratus. Patient has learned stretches to keep the flexibility of the spine and unweight the spine in quadruped. Patient has learned how to lay on her side with less strain on the lumbar. Patient will benefit from skilled therapy to work onspinal mobility and correct pelvis to reduce pain and improve lumbar strength while managing her diastaiss recti.    Personal Factors and Comorbidities  Comorbidity 1    Comorbidities  second trimester    Examination-Activity Limitations  Locomotion Level;Bed Mobility;Bend;Caring for Others;Continence;Stand;Stairs    Examination-Participation Restrictions  Cleaning;Meal Prep;Community Activity;Shop    Stability/Clinical Decision Making  Evolving/Moderate complexity    Rehab Potential  Excellent    PT Frequency  1x / week    PT Duration  8 weeks    PT Treatment/Interventions  ADLs/Self Care Home Management;Cryotherapy;Moist Heat;Neuromuscular  re-education;Therapeutic exercise;Therapeutic activities;Functional mobility training;Patient/family education;Manual techniques;Taping;Spinal Manipulations    PT Next Visit Plan  correct pelvis, posture with standing and home tasks, core strength; ways to manage the diastasis recti, work on right quadratus    PT Home Exercise Plan  Access Code: [email or web address]    Recommended Other Services   MD signed intial eval    Consulted and Agree with Plan of Care  Patient       Patient will benefit from skilled therapeutic intervention in order to improve the following deficits and impairments:  Decreased range of motion, Increased fascial restricitons, Increased muscle spasms, Decreased activity tolerance, Pain, Decreased strength, Decreased mobility  Visit Diagnosis: Acute low back pain without sciatica, unspecified back pain laterality  Muscle weakness (generalized)  Muscle spasm of back     Problem List Patient Active Problem List   Diagnosis Date Noted  . Suspected fetal abnormality affecting management of mother, antepartum 12/16/2019  . 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 Abnormal chromosomal and genetic finding on antenatal screening mother 11/21/2019  . 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 Family history of genetic disease 10/20/2019  . Supervision of other normal pregnancy, antepartum 09/20/2019    Eulis Foster, PT 12/22/19 11:03 AM   [facility] [address] [place], [place], [address] Phone: [phone]   Fax:  [phone]  Name: Shenice Dolder MRN: [id number] Date of Birth: 04-19-1986

## 2019-12-28 ENCOUNTER — Ambulatory Visit: Payer: BC Managed Care – PPO | Admitting: Physical Therapy

## 2019-12-28 ENCOUNTER — Other Ambulatory Visit: Payer: Self-pay

## 2019-12-28 ENCOUNTER — Encounter: Payer: Self-pay | Admitting: Physical Therapy

## 2019-12-28 DIAGNOSIS — M545 Low back pain, unspecified: Secondary | ICD-10-CM

## 2019-12-28 DIAGNOSIS — M62830 Muscle spasm of back: Secondary | ICD-10-CM

## 2019-12-28 DIAGNOSIS — M6281 Muscle weakness (generalized): Secondary | ICD-10-CM

## 2019-12-28 NOTE — Therapy (Signed)
[facility] [address], [address] [place], [place], [address] Phone: [phone]   Fax:  [phone]  Physical Therapy Treatment  Patient Details  Name: Kathy Nichols MRN: [id number] Date of Birth: [birth year] Referring Provider (PT): Dr. Elsie Lincoln   Encounter Date: 12/28/2019  PT End of Session - 12/28/19 0858    Visit Number  3    Date for PT Re-Evaluation  02/08/20    Authorization Type  BCBS    PT Start Time  0858   Pt late   PT Stop Time  0932    PT Time Calculation (min)  34 min    Activity Tolerance  Patient tolerated treatment well;No increased pain    Behavior During Therapy  WFL for tasks assessed/performed       Past Medical History:  Diagnosis Date  . Migraine     Past Surgical History:  Procedure Laterality Date  . TONSILLECTOMY    . WISDOM TOOTH EXTRACTION      There were no vitals filed for this visit.  Subjective Assessment - 12/28/19 0859    Subjective  I am feelin gmuch better. Currently a little pain is a 3/10.    Currently in Pain?  Yes    Pain Score  3     Pain Location  --   Central base of spine   Pain Onset  More than a month ago    Multiple Pain Sites  No                       [facility] Adult PT Treatment/Exercise - 12/28/19 0001      Lumbar Exercises: Stretches   Standing Side Bend  Right;5 reps    Standing Side Bend Limitations  VC to push RT heel into floor to ground her pelvis more    Other Lumbar Stretch Exercise  Seated RT hamstring stretch 3x 20 sec      Lumbar Exercises: Standing   Other Standing Lumbar Exercises  Posterior pelvic tilt against the wall: 10x 10 sec hold added to HEP      Lumbar Exercises: Seated   Other Seated Lumbar Exercises  Seated Cat/Camel instruction 5x. can use for home when she cannot go quadruped.              PT Education - 12/28/19 0929    Education Details  Use of the scaral pillow to take pressure off her tailbone.     Person(s) Educated  Patient    Methods  Explanation;Demonstration;Handout   [employer]   Comprehension  Verbalized understanding;Returned demonstration       PT Short Term Goals - 12/22/19 1101      PT SHORT TERM GOAL #1   Title  independent with initial HEP    Period  Weeks    Status  On-going      PT SHORT TERM GOAL #2   Title  pain in lumbar and right buttocks decreased >/= 25%    Time  4    Period  Weeks    Status  Achieved      PT SHORT TERM GOAL #3   Title  looked into a belly band to brace her abdomen due to her diastasis recti    Time  4    Period  Weeks    Status  On-going        PT Long Term Goals - 12/14/19 1357      PT LONG TERM GOAL #  1   Title  independent with advanced HEP to support her abdomen and back    Time  8    Period  Weeks    Status  New    Target Date  02/08/20      PT LONG TERM GOAL #2   Title  lumbar and right buttocks pain decreased </= 1/10 due to pelvis in correct alignment and lumbar mobiity    Time  8    Period  Weeks    Status  New    Target Date  02/08/20      PT LONG TERM GOAL #3   Title  demonstrate correct body mechanics to reduce strain on lumbar and diastasis recti while being pregnant    Time  8    Period  Weeks    Status  New    Target Date  02/08/20      PT LONG TERM GOAL #4   Title  FOTO score </= 33% limitation    Time  8    Period  Weeks    Status  New    Target Date  02/08/20      PT LONG TERM GOAL #5   Title  stand to cook a full dinner for the family with pain </= 1-2/10 due to improved back and pelvic stability    Time  8    Period  Weeks    Status  New    Target Date  02/08/20            Plan - 12/28/19 2542    Clinical Impression Statement  Pt arrives late for appointment today. She reports her gluteal pain is abolished and remains with "just a little pain in my tailbone." PTA educated pt about the sacral pillow that she could look into ( sold on [employer]) which would support her pelvis when  sitting but give her sacrum no pressure. She reports she would look into getting one. Pt was given some new exercises for HEP progression today inaddition to showing how to perform some of her current exercises in different positions. this will give her greater flexibility as her pregnancy progresses. No pain at end of session.    Personal Factors and Comorbidities  Comorbidity 1    Comorbidities  second trimester    Examination-Activity Limitations  Locomotion Level;Bed Mobility;Bend;Caring for Others;Continence;Stand;Stairs    Examination-Participation Restrictions  Cleaning;Meal Prep;Community Activity;Shop    Stability/Clinical Decision Making  Evolving/Moderate complexity    Rehab Potential  Excellent    PT Frequency  1x / week    PT Duration  8 weeks    PT Treatment/Interventions  ADLs/Self Care Home Management;Cryotherapy;Moist Heat;Neuromuscular re-education;Therapeutic exercise;Therapeutic activities;Functional mobility training;Patient/family education;Manual techniques;Taping;Spinal Manipulations    PT Next Visit Plan  See how all exercises are going, follow up with sacral pillow if pt purchased, consider mid/upper back strength to prepare her for feeding, carrying tasks.    PT Home Exercise Plan  Access Code: [email or web address]    [email or web address] and Agree with Plan of Care  Patient       Patient will benefit from skilled therapeutic intervention in order to improve the following deficits and impairments:  Decreased range of motion, Increased fascial restricitons, Increased muscle spasms, Decreased activity tolerance, Pain, Decreased strength, Decreased mobility  Visit Diagnosis: Acute low back pain without sciatica, unspecified back pain laterality  Muscle weakness (generalized)  Muscle spasm of back     Problem List Patient Active Problem List   Diagnosis Date Noted  .  Suspected fetal abnormality affecting management of mother, antepartum 12/16/2019  . Marginal insertion of umbilical  cord affecting management of mother in second trimester 11/21/2019  . Abnormal chromosomal and genetic finding on antenatal screening mother 11/21/2019  . 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 Family history of genetic disease 10/20/2019  . Supervision of other normal pregnancy, antepartum 09/20/2019    Qualyn Oyervides, PTA 12/28/2019, 11:11 AM  [facility] [address] [place], [place], [address] Phone: [phone]   Fax:  [phone]  Name: Promise Weldin MRN: [id number] Date of Birth: 12-29-1985

## 2019-12-30 ENCOUNTER — Encounter (HOSPITAL_COMMUNITY): Payer: Self-pay | Admitting: Obstetrics and Gynecology

## 2020-01-05 ENCOUNTER — Ambulatory Visit: Payer: BC Managed Care – PPO | Admitting: Physical Therapy

## 2020-01-05 ENCOUNTER — Other Ambulatory Visit: Payer: Self-pay

## 2020-01-05 ENCOUNTER — Encounter: Payer: Self-pay | Admitting: Physical Therapy

## 2020-01-05 DIAGNOSIS — M62830 Muscle spasm of back: Secondary | ICD-10-CM

## 2020-01-05 DIAGNOSIS — M545 Low back pain, unspecified: Secondary | ICD-10-CM

## 2020-01-05 DIAGNOSIS — M6281 Muscle weakness (generalized): Secondary | ICD-10-CM

## 2020-01-05 NOTE — Patient Instructions (Signed)
Access Code: [email or web address]  URL: [email or web address]  Date: 12/28/2019  Prepared by: Eulis Foster   Exercises Cat-Camel - 10 reps - 1 sets - 1x daily - 7x weekly Child's Pose Stretch - 1 reps - 1 sets - 30 sec hold - 1x daily - 7x weekly Seated Piriformis Stretch with Trunk Bend - 1 reps - 1 sets - 30 sec hold - 1x daily - 7x weekly Seated Hamstring Stretch - 1 reps - 1 sets - 30 sec hold - 1x daily - 7x weekly Seated Hip Flexor Stretch - 1 reps - 1 sets - 30 sec hold - 1x daily - 7x weekly Seated Lateral Trunk Stretch on Swiss Ball - 1 reps - 1 sets - 15 sec hold - 1x daily - 7x weekly Seated Cat Camel - 10 reps - 1 sets - 1x daily - 7x weekly Seated Hamstring Stretch - 3 reps - 1 sets - 1x daily - 7x weekly Standing with Back Flat Against Wall - 10 reps - 1 sets - 10 hold - 3x daily - 7x weekly Standing Sidebending with Chair Support - 5 reps - 3 sets - 3 hold                            - 2x daily - 7x weekly [facility] [address], [address] [place], [place] [address] Phone # [phone] Fax [phone]

## 2020-01-05 NOTE — Therapy (Addendum)
[facility] [address] [place], [place], [address] Phone: [phone]   Fax:  [phone]  Physical Therapy Treatment  Patient Details  Name: Kathy Nichols MRN: [id number] Date of Birth: May 14, 1986 Referring Provider (PT): Dr. Silas Sacramento   Encounter Date: 01/05/2020  PT End of Session - 01/05/20 1456    Visit Number  4    Date for PT Re-Evaluation  02/08/20    Authorization Type  BCBS    PT Start Time  8921    PT Stop Time  1525    PT Time Calculation (min)  40 min    Activity Tolerance  Patient tolerated treatment well;No increased pain    Behavior During Therapy  WFL for tasks assessed/performed       Past Medical History:  Diagnosis Date  . Migraine     Past Surgical History:  Procedure Laterality Date  . TONSILLECTOMY    . WISDOM TOOTH EXTRACTION      There were no vitals filed for this visit.  Subjective Assessment - 01/05/20 1449    Subjective  I feel like I pulled something on the right low back. The walking has helped the pain. I am able to stand alot more. I have  more belly limitations. I am due May 8th.    Patient Stated Goals  reduce pain    Currently in Pain?  No/denies         [facility] PT Assessment - 01/05/20 0001      Assessment   Medical Diagnosis  099.891, M54.9 back pain affecting pregnancy, anterpartum    Referring Provider (PT)  Dr. Silas Sacramento    Onset Date/Surgical Date  10/09/19    Prior Therapy  none      Precautions   Precautions  Other (comment)    Precaution Comments  second trimester; due 04/14/2020      Restrictions   Weight Bearing Restrictions  No      Home Environment   Living Environment  Private residence      Prior Function   Level of [place]  Works at home    Leisure  stopped due to [employer]   Overall Cognitive Status  Within Functional Limits for tasks assessed      Observation/Other Assessments   Focus on Therapeutic Outcomes (FOTO)   goal is 33% limitation      Posture/Postural Control   Posture/Postural Control  No significant limitations      AROM   Lumbar Flexion  full    Lumbar Extension  full    Lumbar - Right Side Bend  full    Lumbar - Left Side Bend  full    Lumbar - Right Rotation  full    Lumbar - Left Rotation  full      Strength   Right Hip Extension  5/5    Right Hip ABduction  5/5    Left Hip ABduction  5/5                   [facility] Adult PT Treatment/Exercise - 01/05/20 0001      Therapeutic Activites    Therapeutic Activities  Other Therapeutic Activities    Other Therapeutic Activities  discussed with patient on ways to work on birthing process and how to protect her back, not laying down if not have to, how to position herself in standing and sidely  Exercises   Exercises  Other Exercises    Other Exercises   verbally reviewed HEP with patient while looking at the [place] exercise program;      Lumbar Exercises: Stretches   Hip Flexor Stretch  Right;Left;1 rep;30 seconds    Hip Flexor Stretch Limitations  sitting      Lumbar Exercises: Sidelying   Other Sidelying Lumbar Exercises  lay on left side and bring hand together and rotate  trunk      Manual Therapy   Manual Therapy  Soft tissue mobilization;Joint mobilization    Joint Mobilization  gapping of the right side of T10-L5    Soft tissue mobilization  using the addaday to the right gluteal and SI joint             PT Education - 01/05/20 1525    Education Details  Access Code: [email or web address]    Person(s) Educated  Patient    Methods  Explanation;Demonstration;Verbal cues;Handout    Comprehension  Verbalized understanding;Returned demonstration       PT Short Term Goals - 01/05/20 1528      PT SHORT TERM GOAL #1   Title  independent with initial HEP    Time  4    Period  Weeks    Status  Achieved    Target Date  01/11/20      PT SHORT TERM GOAL #2   Title  pain  in lumbar and right buttocks decreased >/= 25%    Time  4    Period  Weeks    Status  Achieved    Target Date  01/11/20      PT SHORT TERM GOAL #3   Title  looked into a belly band to brace her abdomen due to her diastasis recti    Time  4    Period  Weeks    Status  Achieved        PT Long Term Goals - 01/05/20 1519      PT LONG TERM GOAL #1   Title  independent with advanced HEP to support her abdomen and back    Time  8    Period  Weeks    Status  Achieved      PT LONG TERM GOAL #2   Title  lumbar and right buttocks pain decreased </= 1/10 due to pelvis in correct alignment and lumbar mobiity    Time  8    Period  Weeks    Status  Achieved      PT LONG TERM GOAL #3   Title  demonstrate correct body mechanics to reduce strain on lumbar and diastasis recti while being pregnant    Time  8    Period  Weeks    Status  Achieved      PT LONG TERM GOAL #4   Title  FOTO score </= 33% limitation    Period  Weeks    Status  Achieved            Plan - 01/05/20 1511    Clinical Impression Statement  Patient has met her goals. Patient reports no pain today. If she has pain it is minimal. Patient has full lumbar mobility. Patient has full hip strength. Patient understands different ways to have a baby to reduce strain on her back. Patient understands her HEP and is independent. Patient is ready for discharge.    Personal Factors and Comorbidities  Comorbidity 1    Comorbidities  second trimester  Examination-Activity Limitations  Locomotion Level;Bed Mobility;Bend;Caring for Others;Continence;Stand;Stairs    Examination-Participation Restrictions  Cleaning;Meal Prep;Community Activity;Shop    Stability/Clinical Decision Making  Evolving/Moderate complexity    Rehab Potential  Excellent    PT Treatment/Interventions  ADLs/Self Care Home Management;Cryotherapy;Moist Heat;Neuromuscular re-education;Therapeutic exercise;Therapeutic activities;Functional mobility  training;Patient/family education;Manual techniques;Taping;Spinal Manipulations    PT Next Visit Plan  Discharge to HEP    PT Home Exercise Plan  Access Code: [email or web address]    Consulted and Agree with Plan of Care  Patient       Patient will benefit from skilled therapeutic intervention in order to improve the following deficits and impairments:     Visit Diagnosis: Acute low back pain without sciatica, unspecified back pain laterality  Muscle weakness (generalized)  Muscle spasm of back     Problem List Patient Active Problem List   Diagnosis Date Noted  . Suspected fetal abnormality affecting management of mother, antepartum 12/16/2019  . 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 Abnormal chromosomal and genetic finding on antenatal screening mother 11/21/2019  . 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 Family history of genetic disease 10/20/2019  . Supervision of other normal pregnancy, antepartum 09/20/2019    Earlie Counts, PT 01/05/20 3:29 PM   [facility] [address] [place], [place], [address] Phone: [phone]   Fax:  [phone]  Name: Kathy Nichols MRN: [id number] Date of Birth: 1986-01-22  PHYSICAL THERAPY DISCHARGE SUMMARY  Visits from Start of Care: 4 Current functional level related to goals / functional outcomes: See above.    Remaining deficits: See above.    Education / Equipment: HEP Plan: Patient agrees to discharge.  Patient goals were met. Patient is being discharged due to meeting the stated rehab goals. Thank you for the referral. Earlie Counts, PT 02/06/20 2:15 PM   ?????

## 2020-01-10 ENCOUNTER — Other Ambulatory Visit: Payer: Self-pay

## 2020-01-10 ENCOUNTER — Other Ambulatory Visit (HOSPITAL_COMMUNITY): Payer: Self-pay | Admitting: *Deleted

## 2020-01-10 ENCOUNTER — Ambulatory Visit (HOSPITAL_COMMUNITY): Payer: BC Managed Care – PPO | Admitting: *Deleted

## 2020-01-10 ENCOUNTER — Encounter (HOSPITAL_COMMUNITY): Payer: Self-pay

## 2020-01-10 ENCOUNTER — Ambulatory Visit (HOSPITAL_COMMUNITY)
Admission: RE | Admit: 2020-01-10 | Discharge: 2020-01-10 | Disposition: A | Discharge status: Home / Self Care | Payer: BC Managed Care – PPO | Source: Ambulatory Visit | Attending: Maternal & Fetal Medicine | Admitting: Maternal & Fetal Medicine

## 2020-01-10 DIAGNOSIS — O285 Abnormal chromosomal and genetic finding on antenatal screening of mother: Secondary | ICD-10-CM | POA: Diagnosis present

## 2020-01-10 DIAGNOSIS — Z3A26 26 weeks gestation of pregnancy: Secondary | ICD-10-CM

## 2020-01-10 DIAGNOSIS — O280 Abnormal hematological finding on antenatal screening of mother: Secondary | ICD-10-CM

## 2020-01-10 DIAGNOSIS — O43192 Other malformation of placenta, second trimester: Secondary | ICD-10-CM

## 2020-01-10 DIAGNOSIS — O43199 Other malformation of placenta, unspecified trimester: Secondary | ICD-10-CM | POA: Diagnosis present

## 2020-01-10 DIAGNOSIS — Z362 Encounter for other antenatal screening follow-up: Secondary | ICD-10-CM

## 2020-01-16 ENCOUNTER — Ambulatory Visit (INDEPENDENT_AMBULATORY_CARE_PROVIDER_SITE_OTHER): Payer: BC Managed Care – PPO | Admitting: Obstetrics & Gynecology

## 2020-01-16 ENCOUNTER — Other Ambulatory Visit: Payer: Self-pay

## 2020-01-16 DIAGNOSIS — Z3A27 27 weeks gestation of pregnancy: Secondary | ICD-10-CM

## 2020-01-16 DIAGNOSIS — Z23 Encounter for immunization: Secondary | ICD-10-CM | POA: Diagnosis not present

## 2020-01-16 DIAGNOSIS — Z3480 Encounter for supervision of other normal pregnancy, unspecified trimester: Secondary | ICD-10-CM

## 2020-01-16 DIAGNOSIS — O359XX0 Maternal care for (suspected) fetal abnormality and damage, unspecified, not applicable or unspecified: Secondary | ICD-10-CM

## 2020-01-16 NOTE — Progress Notes (Signed)
   PRENATAL VISIT NOTE  Subjective:  Kathy Nichols is a 34 y.o. G3P2002 at [redacted]w[redacted]d being seen today for ongoing prenatal care.  She is currently monitored for the following issues for this high-risk pregnancy and has Supervision of other normal pregnancy, antepartum; Family history of genetic disease; Marginal insertion of umbilical cord affecting management of mother in second trimester; Abnormal chromosomal and genetic finding on antenatal screening mother; Rectus diastasis; and Suspected fetal abnormality affecting management of mother, antepartum on their problem list.  Patient reports round ligament pain.   .  .   . Denies leaking of fluid.   The following portions of the patient's history were reviewed and updated as appropriate: allergies, current medications, past family history, past medical history, past social history, past surgical history and problem list.   Objective:  There were no vitals filed for this visit.  Fetal Status:           General:  Alert, oriented and cooperative. Patient is in no acute distress.  Skin: Skin is warm and dry. No rash noted.   Cardiovascular: Normal heart rate noted  Respiratory: Normal respiratory effort, no problems with respiration noted  Abdomen: Soft, gravid, appropriate for gestational age.        Pelvic: Cervical exam deferred        Extremities: Normal range of motion.     Mental Status: Normal mood and affect. Normal behavior. Normal judgment and thought content.   Assessment and Plan:  Pregnancy: G3P2002 at [redacted]w[redacted]d 1. Suspected fetal abnormality affecting management of mother, antepartum, single or unspecified fetus Fetal Echo normal  No follow up needed.  2.  Marginal cord insertion Fetal growth on 02/14/20  3.  [facility] PT helped and pain is gone  4.  Babyscripts data crossing and weight still off.  Babyscripts contacted again.  All BPs normal.    5.  TDAP, GTT, and labs today   Preterm labor symptoms and general obstetric precautions  including but not limited to vaginal bleeding, contractions, leaking of fluid and fetal movement were reviewed in detail with the patient. Please refer to After Visit Summary for other counseling recommendations.   Return in about 4 weeks (around 02/13/2020) for Routine OB.  Future Appointments  Date Time Provider Department Center  02/14/2020  1:30 PM [facility] NURSE [facility]  02/14/2020  1:30 PM [facility] [place] 5 [facility]-US    Elsie Lincoln, MD

## 2020-01-17 ENCOUNTER — Other Ambulatory Visit: Payer: Self-pay | Admitting: Obstetrics & Gynecology

## 2020-01-17 MED ORDER — DOCUSATE SODIUM 100 MG PO CAPS: 100.0000 mg | ORAL_CAPSULE | Freq: Two times a day (BID) | ORAL | 2 refills | Status: DC | PRN

## 2020-01-17 MED ORDER — FERROUS SULFATE 325 (65 FE) MG PO TABS: 325.0000 mg | ORAL_TABLET | Freq: Two times a day (BID) | ORAL | 1 refills | Status: DC

## 2020-01-19 LAB — CBC
HCT: 31.2 % — ABNORMAL LOW (ref 35.0–45.0)
Hemoglobin: 9.7 g/dL — ABNORMAL LOW (ref 11.7–15.5)
MCH: 24.6 pg — ABNORMAL LOW (ref 27.0–33.0)
MCHC: 31.1 g/dL — ABNORMAL LOW (ref 32.0–36.0)
MCV: 79.2 fL — ABNORMAL LOW (ref 80.0–100.0)
MPV: 10.3 fL (ref 7.5–12.5)
Platelets: 214 10*3/uL (ref 140–400)
RBC: 3.94 10*6/uL (ref 3.80–5.10)
RDW: 14.2 % (ref 11.0–15.0)
WBC: 7 10*3/uL (ref 3.8–10.8)

## 2020-01-19 LAB — FERRITIN: Ferritin: 4 ng/mL — ABNORMAL LOW (ref 16–154)

## 2020-01-19 LAB — HIV ANTIBODY (ROUTINE TESTING W REFLEX): HIV 1&2 Ab, 4th Generation: NONREACTIVE

## 2020-01-19 LAB — 2HR GTT W 1 HR, CARPENTER, 75 G
Glucose, 1 Hr, Gest: 100 mg/dL (ref 65–179)
Glucose, 2 Hr, Gest: 84 mg/dL (ref 65–152)
Glucose, Fasting, Gest: 76 mg/dL (ref 65–91)

## 2020-01-19 LAB — RPR: RPR Ser Ql: NONREACTIVE

## 2020-02-13 ENCOUNTER — Other Ambulatory Visit: Payer: Self-pay

## 2020-02-13 ENCOUNTER — Encounter: Payer: Self-pay | Admitting: Obstetrics & Gynecology

## 2020-02-13 ENCOUNTER — Ambulatory Visit (INDEPENDENT_AMBULATORY_CARE_PROVIDER_SITE_OTHER): Payer: BC Managed Care – PPO | Admitting: Obstetrics & Gynecology

## 2020-02-13 VITALS — BP 96/55 | HR 86 | Temp 98.3°F | Wt 177.0 lb

## 2020-02-13 DIAGNOSIS — O99013 Anemia complicating pregnancy, third trimester: Secondary | ICD-10-CM | POA: Diagnosis not present

## 2020-02-13 DIAGNOSIS — Z3480 Encounter for supervision of other normal pregnancy, unspecified trimester: Secondary | ICD-10-CM

## 2020-02-13 DIAGNOSIS — D509 Iron deficiency anemia, unspecified: Secondary | ICD-10-CM | POA: Insufficient documentation

## 2020-02-13 DIAGNOSIS — Z3483 Encounter for supervision of other normal pregnancy, third trimester: Secondary | ICD-10-CM

## 2020-02-13 DIAGNOSIS — O43192 Other malformation of placenta, second trimester: Secondary | ICD-10-CM

## 2020-02-13 NOTE — Patient Instructions (Signed)
Return to office for any scheduled appointments. Call the office or go to the MAU at [facility] if:  You begin to have strong, frequent contractions  Your water breaks.  Sometimes it is a big gush of fluid, sometimes it is just a trickle that keeps getting your panties wet or running down your legs  You have vaginal bleeding.  It is normal to have a small amount of spotting if your cervix was checked.   You do not feel your baby moving like normal.  If you do not, get something to eat and drink and lay down and focus on feeling your baby move.   If your baby is still not moving like normal, you should call the office or go to MAU.  Any other obstetric concerns.   

## 2020-02-13 NOTE — Progress Notes (Signed)
   PRENATAL VISIT NOTE  Subjective:  Kathy Nichols is a 34 y.o. G3P2002 at [redacted]w[redacted]d being seen today for ongoing prenatal care.  She is currently monitored for the following issues for this low-risk pregnancy and has Supervision of other normal pregnancy, antepartum; Family history of genetic disease; Marginal insertion of umbilical cord affecting management of mother in second trimester; Abnormal chromosomal and genetic finding on antenatal screening mother; Rectus diastasis; Suspected fetal abnormality affecting management of mother, antepartum; and Maternal iron deficiency anemia complicating pregnancy, third trimester on their problem list.  Patient reports no complaints.  Contractions: Irritability. Vag. Bleeding: None.  Movement: Present. Denies leaking of fluid.   The following portions of the patient's history were reviewed and updated as appropriate: allergies, current medications, past family history, past medical history, past social history, past surgical history and problem list.   Objective:   Vitals:   02/13/20 0847  BP: (!) 96/55  Pulse: 86  Temp: 98.3 F (36.8 C)  Weight: 177 lb (80.3 kg)    Fetal Status: Fetal Heart Rate (bpm): 147   Movement: Present     General:  Alert, oriented and cooperative. Patient is in no acute distress.  Skin: Skin is warm and dry. No rash noted.   Cardiovascular: Normal heart rate noted  Respiratory: Normal respiratory effort, no problems with respiration noted  Abdomen: Soft, gravid, appropriate for gestational age.  Pain/Pressure: Absent     Pelvic: Cervical exam deferred        Extremities: Normal range of motion.  Edema: Trace  Mental Status: Normal mood and affect. Normal behavior. Normal judgment and thought content.   Assessment and Plan:  Pregnancy: G3P2002 at [redacted]w[redacted]d 1. Maternal iron deficiency anemia complicating pregnancy, third trimester Has been taking iron daily, will recheck labs today and manage accordingly.  - CBC -  Ferritin  2. Marginal insertion of umbilical cord affecting management of mother in second trimester Normal growth, next scan scheduled 02/14/20.  3. Supervision of other normal pregnancy, antepartum Preterm labor symptoms and general obstetric precautions including but not limited to vaginal bleeding, contractions, leaking of fluid and fetal movement were reviewed in detail with the patient. Please refer to After Visit Summary for other counseling recommendations.   Return in about 2 weeks (around 02/27/2020) for Virtual OB Visit// 5 weeks: OFFICE OB Visit, Pelvic cultures.  Future Appointments  Date Time Provider Department Center  02/14/2020  1:30 PM [facility] NURSE [facility]  02/14/2020  1:30 PM [facility]-MFC [place] 5 [facility]-M[facility] MFC-US    Jaynie Collins, MD

## 2020-02-14 ENCOUNTER — Ambulatory Visit (HOSPITAL_COMMUNITY)
Admission: RE | Admit: 2020-02-14 | Discharge: 2020-02-14 | Disposition: A | Discharge status: Home / Self Care | Payer: BC Managed Care – PPO | Source: Ambulatory Visit | Attending: Obstetrics and Gynecology | Admitting: Obstetrics and Gynecology

## 2020-02-14 ENCOUNTER — Other Ambulatory Visit: Payer: Self-pay

## 2020-02-14 ENCOUNTER — Other Ambulatory Visit (HOSPITAL_COMMUNITY): Payer: Self-pay | Admitting: *Deleted

## 2020-02-14 ENCOUNTER — Encounter (HOSPITAL_COMMUNITY): Payer: Self-pay | Admitting: *Deleted

## 2020-02-14 ENCOUNTER — Ambulatory Visit (HOSPITAL_COMMUNITY): Payer: BC Managed Care – PPO | Admitting: *Deleted

## 2020-02-14 DIAGNOSIS — O43192 Other malformation of placenta, second trimester: Secondary | ICD-10-CM | POA: Insufficient documentation

## 2020-02-14 DIAGNOSIS — O43193 Other malformation of placenta, third trimester: Secondary | ICD-10-CM | POA: Diagnosis not present

## 2020-02-14 DIAGNOSIS — O99013 Anemia complicating pregnancy, third trimester: Secondary | ICD-10-CM | POA: Diagnosis present

## 2020-02-14 DIAGNOSIS — O285 Abnormal chromosomal and genetic finding on antenatal screening of mother: Secondary | ICD-10-CM | POA: Insufficient documentation

## 2020-02-14 DIAGNOSIS — Z362 Encounter for other antenatal screening follow-up: Secondary | ICD-10-CM

## 2020-02-14 DIAGNOSIS — Z3A31 31 weeks gestation of pregnancy: Secondary | ICD-10-CM | POA: Diagnosis not present

## 2020-02-14 DIAGNOSIS — O280 Abnormal hematological finding on antenatal screening of mother: Secondary | ICD-10-CM | POA: Insufficient documentation

## 2020-02-14 DIAGNOSIS — D509 Iron deficiency anemia, unspecified: Secondary | ICD-10-CM | POA: Insufficient documentation

## 2020-02-14 DIAGNOSIS — O43199 Other malformation of placenta, unspecified trimester: Secondary | ICD-10-CM

## 2020-02-14 LAB — CBC
HCT: 34.8 % — ABNORMAL LOW (ref 35.0–45.0)
Hemoglobin: 10.9 g/dL — ABNORMAL LOW (ref 11.7–15.5)
MCH: 25.2 pg — ABNORMAL LOW (ref 27.0–33.0)
MCHC: 31.3 g/dL — ABNORMAL LOW (ref 32.0–36.0)
MCV: 80.6 fL (ref 80.0–100.0)
MPV: 10.7 fL (ref 7.5–12.5)
Platelets: 186 10*3/uL (ref 140–400)
RBC: 4.32 10*6/uL (ref 3.80–5.10)
RDW: 16.9 % — ABNORMAL HIGH (ref 11.0–15.0)
WBC: 7.2 10*3/uL (ref 3.8–10.8)

## 2020-02-14 LAB — FERRITIN: Ferritin: 11 ng/mL — ABNORMAL LOW (ref 16–154)

## 2020-02-14 MED ORDER — ASCORBIC ACID 500 MG PO TABS: 500.0000 mg | ORAL_TABLET | Freq: Every day | ORAL | 2 refills | Status: DC

## 2020-02-14 MED ORDER — POLYSACCHARIDE IRON COMPLEX 150 MG PO CAPS: 150.0000 mg | ORAL_CAPSULE | Freq: Every day | ORAL | 2 refills | Status: DC

## 2020-02-14 NOTE — Addendum Note (Signed)
Addended by: Jaynie Collins A on: 02/14/2020 08:31 AM   Modules accepted: Orders

## 2020-02-28 ENCOUNTER — Telehealth (INDEPENDENT_AMBULATORY_CARE_PROVIDER_SITE_OTHER): Payer: BC Managed Care – PPO | Admitting: Advanced Practice Midwife

## 2020-02-28 DIAGNOSIS — R102 Pelvic and perineal pain: Secondary | ICD-10-CM

## 2020-02-28 DIAGNOSIS — M549 Dorsalgia, unspecified: Secondary | ICD-10-CM

## 2020-02-28 DIAGNOSIS — O26893 Other specified pregnancy related conditions, third trimester: Secondary | ICD-10-CM

## 2020-02-28 DIAGNOSIS — Z3480 Encounter for supervision of other normal pregnancy, unspecified trimester: Secondary | ICD-10-CM

## 2020-02-28 DIAGNOSIS — G479 Sleep disorder, unspecified: Secondary | ICD-10-CM

## 2020-02-28 DIAGNOSIS — O99891 Other specified diseases and conditions complicating pregnancy: Secondary | ICD-10-CM

## 2020-02-28 NOTE — Progress Notes (Signed)
   TELEHEALTH VIRTUAL OBSTETRICS VISIT ENCOUNTER NOTE  I connected with Kathy Nichols on 02/28/20 at  1:40 PM EDT by telephone at home and verified that I am speaking with the correct person using two identifiers.   I discussed the limitations, risks, security and privacy concerns of performing an evaluation and management service by telephone and the availability of in person appointments. I also discussed with the patient that there may be a patient responsible charge related to this service. The patient expressed understanding and agreed to proceed.  Subjective:  Kathy Nichols is a 34 y.o. G3P2002 at [redacted]w[redacted]d being followed for ongoing prenatal care.  She is currently monitored for the following issues for this low-risk pregnancy and has Supervision of other normal pregnancy, antepartum; Family history of genetic disease; Marginal insertion of umbilical cord affecting management of mother in second trimester; Abnormal chromosomal and genetic finding on antenatal screening mother; Rectus diastasis; Suspected fetal abnormality affecting management of mother, antepartum; and Maternal iron deficiency anemia complicating pregnancy, third trimester on their problem list.  Patient reports backache and occasional contractions. Reports fetal movement. Denies any contractions, bleeding or leaking of fluid.   The following portions of the patient's history were reviewed and updated as appropriate: allergies, current medications, past family history, past medical history, past social history, past surgical history and problem list.   Objective:   General:  Alert, oriented and cooperative.   Mental Status: Normal mood and affect perceived. Normal judgment and thought content.  Rest of physical exam deferred due to type of encounter  Assessment and Plan:  Pregnancy: G3P2002 at [redacted]w[redacted]d 1. Supervision of other normal pregnancy, antepartum --Pt reports good fetal movement, denies cramping, LOF, or vaginal bleeding  --BP wnl --Next visit in office at 36 weeks  2. Back pain affecting pregnancy, antepartum --Improved with pregnancy support belt  3. Pelvic pain affecting pregnancy in third trimester, antepartum --Worse at night, and when getting out of bed, better now than earlier in pregnancy before PT. She is doing PT exercises at home daily which help.  4. Sleep disturbance --Sleep changes related to discomforts of pregnancy, increased urination, her other 2 small children waking at night.   --Brainstormed some ways to get a little more sleep. Discussed normal changes to prepare for waking with newborn.  Pt doing well, is coping with lack of sleep but is hoping for more sleep in a few weeks/months after baby is born.   Preterm labor symptoms and general obstetric precautions including but not limited to vaginal bleeding, contractions, leaking of fluid and fetal movement were reviewed in detail with the patient.  I discussed the assessment and treatment plan with the patient. The patient was provided an opportunity to ask questions and all were answered. The patient agreed with the plan and demonstrated an understanding of the instructions. The patient was advised to call back or seek an in-person office evaluation/go to MAU at [facility] for any urgent or concerning symptoms. Please refer to After Visit Summary for other counseling recommendations.   I provided 15 minutes of face-to-face time during this encounter.  No follow-ups on file.  Future Appointments  Date Time Provider Department Center  03/14/2020 10:00 AM [facility] NURSE [facility]-US  03/14/2020 10:00 AM [facility]-MFC [place] 3 WH-MFCUS MFC-US  03/20/2020  8:55 AM Sharyon Cable, CNM [facility]    Sharen Counter, CNM [facility] for [employer], [facility]

## 2020-03-14 ENCOUNTER — Encounter (HOSPITAL_COMMUNITY): Payer: Self-pay

## 2020-03-14 ENCOUNTER — Ambulatory Visit (HOSPITAL_COMMUNITY)
Admission: RE | Admit: 2020-03-14 | Discharge: 2020-03-14 | Disposition: A | Discharge status: Home / Self Care | Payer: BC Managed Care – PPO | Source: Ambulatory Visit | Attending: Maternal & Fetal Medicine | Admitting: Maternal & Fetal Medicine

## 2020-03-14 ENCOUNTER — Ambulatory Visit (HOSPITAL_COMMUNITY): Payer: BC Managed Care – PPO | Admitting: *Deleted

## 2020-03-14 ENCOUNTER — Other Ambulatory Visit: Payer: Self-pay

## 2020-03-14 DIAGNOSIS — O99013 Anemia complicating pregnancy, third trimester: Secondary | ICD-10-CM | POA: Diagnosis present

## 2020-03-14 DIAGNOSIS — O43192 Other malformation of placenta, second trimester: Secondary | ICD-10-CM

## 2020-03-14 DIAGNOSIS — O43199 Other malformation of placenta, unspecified trimester: Secondary | ICD-10-CM | POA: Insufficient documentation

## 2020-03-14 DIAGNOSIS — D509 Iron deficiency anemia, unspecified: Secondary | ICD-10-CM

## 2020-03-14 DIAGNOSIS — Z3A35 35 weeks gestation of pregnancy: Secondary | ICD-10-CM | POA: Diagnosis not present

## 2020-03-14 DIAGNOSIS — Z8489 Family history of other specified conditions: Secondary | ICD-10-CM

## 2020-03-14 DIAGNOSIS — O285 Abnormal chromosomal and genetic finding on antenatal screening of mother: Secondary | ICD-10-CM | POA: Diagnosis present

## 2020-03-14 DIAGNOSIS — O43193 Other malformation of placenta, third trimester: Secondary | ICD-10-CM

## 2020-03-14 DIAGNOSIS — Z362 Encounter for other antenatal screening follow-up: Secondary | ICD-10-CM | POA: Diagnosis not present

## 2020-03-20 ENCOUNTER — Telehealth (HOSPITAL_COMMUNITY): Payer: Self-pay | Admitting: *Deleted

## 2020-03-20 ENCOUNTER — Other Ambulatory Visit (HOSPITAL_COMMUNITY)
Admission: RE | Admit: 2020-03-20 | Discharge: 2020-03-20 | Disposition: A | Discharge status: Home / Self Care | Payer: BC Managed Care – PPO | Source: Ambulatory Visit | Attending: Certified Nurse Midwife | Admitting: Certified Nurse Midwife

## 2020-03-20 ENCOUNTER — Encounter: Payer: Self-pay | Admitting: Certified Nurse Midwife

## 2020-03-20 ENCOUNTER — Ambulatory Visit (INDEPENDENT_AMBULATORY_CARE_PROVIDER_SITE_OTHER): Payer: BC Managed Care – PPO | Admitting: Certified Nurse Midwife

## 2020-03-20 ENCOUNTER — Other Ambulatory Visit: Payer: Self-pay

## 2020-03-20 ENCOUNTER — Encounter (HOSPITAL_COMMUNITY): Payer: Self-pay | Admitting: *Deleted

## 2020-03-20 VITALS — BP 100/65 | HR 84 | Temp 98.5°F | Wt 181.0 lb

## 2020-03-20 DIAGNOSIS — D509 Iron deficiency anemia, unspecified: Secondary | ICD-10-CM

## 2020-03-20 DIAGNOSIS — Z3480 Encounter for supervision of other normal pregnancy, unspecified trimester: Secondary | ICD-10-CM | POA: Diagnosis present

## 2020-03-20 DIAGNOSIS — O321XX0 Maternal care for breech presentation, not applicable or unspecified: Secondary | ICD-10-CM

## 2020-03-20 DIAGNOSIS — O43192 Other malformation of placenta, second trimester: Secondary | ICD-10-CM

## 2020-03-20 DIAGNOSIS — O99013 Anemia complicating pregnancy, third trimester: Secondary | ICD-10-CM

## 2020-03-20 LAB — OB RESULTS CONSOLE GBS: GBS: NEGATIVE

## 2020-03-20 NOTE — Patient Instructions (Addendum)
[email or web address]  Version scheduled for 4/19 @ 10AM- please arrive to the hospital at 9AM.   Breech Birth A breech birth is when a baby is born with the buttocks or feet first. Most babies are in a head down (vertex) position when they are born. There are three types of breech babies:  When the baby's buttocks are showing first in the vagina (birth canal) with the legs bent at the knees and the feet down near the buttocks (complete breech).  When the baby's buttocks are showing first in the birth canal with the legs straight up and the feet at the baby's head (frank breech).  When one or both of the baby's feet are showing first in the birth canal along with the buttocks (footling breech). What are the health risks of having a breech birth? Having a breech birth increases the health risks to your baby. A breech birth may cause the following:  Umbilical cord prolapse. This is when the umbilical cord enters the birth canal ahead of the baby, before or during labor. This can cause the cord to become pinched or compressed as labor continues. This can reduce the flow of blood and oxygen to the baby.  The baby getting stuck in the birth canal, which can cause injury or, rarely, death.  Injury to the baby's nerves in the shoulder, arm, and hand (brachial plexus injury) when delivered. What increases the risk of having a breech baby? It is not known what causes your baby to be breech. However, you are more likely to have a breech baby if:  You have had a previous pregnancy.  You are having more than one baby.  Your baby has certain birth (congenital) defects.  You have started your labor earlier than expected (premature labor).  You have problems with your uterus, such as a tumor or abnormally shaped uterus.  You have too much or not enough fluid surrounding the baby (amniotic fluid). How do I know if my baby is breech? There are no symptoms for you to know that your baby is  breech. When you are close to your due date, your health care provider can tell if your baby is breech by doing:  An abdominal or vaginal (pelvic) exam.  An ultrasound. What can be done if my baby is breech?  Your health care provider may try to turn the baby in your uterus. He or she will use a procedure called external cephalic version (ECV). He or she will place both hands on your abdomen and gently and slowly turn the baby around. It is important to know that ECV can increase your chances of suddenly going into labor. For this reason, an ECV is only done toward the end of a healthy pregnancy. The baby may remain in this position, but sometimes he or she may turn back to the breech position. You and your health care provider will discuss if an ECV is recommended for you and your baby. How will I delivery my baby if he or she is breech? You and your health care provider will discuss the best way to deliver your baby. If your baby is breech, it is less likely that a vaginal delivery will be recommended due to the risks to you and your baby. Your health care provider may recommend that you deliver your baby through a Cesarean section (C-section). A C-section is the surgical delivery of a baby through an incision in the abdomen and the uterus. Summary  A breech birth is  when a baby is born with the buttocks or feet first.  Having a breech birth may increase the risks to your baby.  Your health care provider may try to turn your baby in your uterus using a procedure called an external cephalic version (ECV).  If your baby cannot be turned to a head down position or if your baby remains in a breech position, your health care provider will make recommendations about the safest way to deliver your baby. This information is not intended to replace advice given to you by your health care provider. Make sure you discuss any questions you have with your health care provider. Document Revised: 11/06/2017  Document Reviewed: 08/20/2017 Elsevier Patient Education  2020 [employer].   Reasons to go to MAU:  1.  Contractions are  5 minutes apart or less, each last 1 minute, these have been going on for 1-2 hours, and you cannot walk or talk during them 2.  You have a large gush of fluid, or a trickle of fluid that will not stop and you have to wear a pad 3.  You have bleeding that is bright red, heavier than spotting--like menstrual bleeding (spotting can be normal in early labor or after a check of your cervix) 4.  You do not feel the baby moving like he/she normally does

## 2020-03-20 NOTE — Telephone Encounter (Signed)
Preadmission screen  

## 2020-03-20 NOTE — Progress Notes (Signed)
PRENATAL VISIT NOTE  Subjective:  Kathy Nichols is a 34 y.o. G3P2002 at [redacted]w[redacted]d being seen today for ongoing prenatal care.  She is currently monitored for the following issues for this low-risk pregnancy and has Supervision of other normal pregnancy, antepartum; Family history of genetic disease; Marginal insertion of umbilical cord affecting management of mother in second trimester; Abnormal chromosomal and genetic finding on antenatal screening mother; Rectus diastasis; Suspected fetal abnormality affecting management of mother, antepartum; and Maternal iron deficiency anemia complicating pregnancy, third trimester on their problem list.  Patient reports no complaints.  Contractions: Irregular. Vag. Bleeding: None.  Movement: Present. Denies leaking of fluid.   The following portions of the patient's history were reviewed and updated as appropriate: allergies, current medications, past family history, past medical history, past social history, past surgical history and problem list.   Objective:   Vitals:   03/20/20 0909  BP: 100/65  Pulse: 84  Temp: 98.5 F (36.9 C)  Weight: 181 lb (82.1 kg)    Fetal Status: Fetal Heart Rate (bpm): 149 Fundal Height: 36 cm Movement: Present  Presentation: Complete Breech  General:  Alert, oriented and cooperative. Patient is in no acute distress.  Skin: Skin is warm and dry. No rash noted.   Cardiovascular: Normal heart rate noted  Respiratory: Normal respiratory effort, no problems with respiration noted  Abdomen: Soft, gravid, appropriate for gestational age.  Pain/Pressure: Absent     Pelvic: Cervical exam performed in the presence of a chaperone Dilation: 1.5 Effacement (%): 50 Station: -3  Extremities: Normal range of motion.  Edema: Trace  Mental Status: Normal mood and affect. Normal behavior. Normal judgment and thought content.   Assessment and Plan:  Pregnancy: G3P2002 at [redacted]w[redacted]d 1. Supervision of other normal pregnancy, antepartum -  Patient doing well, patient reports irregular contractions since last ultrasound appointment  - anticipatory guidance on upcoming appointments  - Cervicovaginal ancillary only( ) - Culture, beta strep (group b only)  2. Marginal insertion of umbilical cord affecting management of mother in second trimester - last [place] completed on 4/7, follow up as indicated per MFM  - Est. FW:  2582  gm  5 lb 11 oz  34  % at [redacted]w[redacted]d   3. Maternal iron deficiency anemia complicating pregnancy, third trimester - continue medication as prescribed   4. Breech presentation, single or unspecified fetus - Transverse presentation on last [place]  - complete breech today by [place] and cervical examination, head to maternal upper right  - educated and discussed options with patient - given options of watchful waiting or version  - Discussed version in detail with patient and husband via phone. Gave patient time alone to discuss with husband. Patient wants to wait to see if baby turns on own for another week then do version if not. Discussed r/b of version and laboring while breech - patient verbalizes understanding  - Version scheduled for Monday 4/19, encouraged patient to perform spinning babies at home prior to version - website given to patient    Preterm labor symptoms and general obstetric precautions including but not limited to vaginal bleeding, contractions, leaking of fluid and fetal movement were reviewed in detail with the patient. Please refer to After Visit Summary for other counseling recommendations.   Return in about 2 weeks (around 04/03/2020) for ROB-mychart.  Future Appointments  Date Time Provider [place]  03/26/2020  9:00 AM [facility] [place] None  04/03/2020  1:40 PM Lajean Manes, CNM [facility]  [facility]  04/10/2020  2:00 PM Sharyon Cable, CNM [facility]    Sharyon Cable, CNM

## 2020-03-21 ENCOUNTER — Other Ambulatory Visit: Payer: Self-pay | Admitting: Advanced Practice Midwife

## 2020-03-21 LAB — CERVICOVAGINAL ANCILLARY ONLY
Chlamydia: NEGATIVE
Comment: NEGATIVE
Comment: NORMAL
Neisseria Gonorrhea: NEGATIVE

## 2020-03-23 LAB — CULTURE, BETA STREP (GROUP B ONLY)
MICRO NUMBER:: 10357203
SPECIMEN QUALITY:: ADEQUATE

## 2020-03-24 ENCOUNTER — Other Ambulatory Visit (HOSPITAL_COMMUNITY): Admission: RE | Admit: 2020-03-24 | Payer: BC Managed Care – PPO | Source: Ambulatory Visit

## 2020-03-26 ENCOUNTER — Inpatient Hospital Stay (HOSPITAL_COMMUNITY)
Admission: RE | Admit: 2020-03-26 | Payer: BC Managed Care – PPO | Source: Ambulatory Visit | Admitting: Obstetrics and Gynecology

## 2020-03-28 ENCOUNTER — Other Ambulatory Visit: Payer: Self-pay

## 2020-03-28 ENCOUNTER — Ambulatory Visit (INDEPENDENT_AMBULATORY_CARE_PROVIDER_SITE_OTHER): Payer: BC Managed Care – PPO | Admitting: Obstetrics and Gynecology

## 2020-03-28 DIAGNOSIS — Z3480 Encounter for supervision of other normal pregnancy, unspecified trimester: Secondary | ICD-10-CM

## 2020-03-28 NOTE — Progress Notes (Signed)
   PRENATAL VISIT NOTE  Subjective:  Kathy Nichols is a 34 y.o. G3P2002 at [redacted]w[redacted]d being seen today for ongoing prenatal care.  She is currently monitored for the following issues for this high-risk pregnancy and has Supervision of other normal pregnancy, antepartum; Family history of genetic disease; Marginal insertion of umbilical cord affecting management of mother in second trimester; Abnormal chromosomal and genetic finding on antenatal screening mother; Rectus diastasis; Suspected fetal abnormality affecting management of mother, antepartum; Maternal iron deficiency anemia complicating pregnancy, third trimester; and Breech presentation on their problem list.  Patient reports no complaints.  Contractions: Irritability. Vag. Bleeding: None.  Movement: Present. Denies leaking of fluid.   The following portions of the patient's history were reviewed and updated as appropriate: allergies, current medications, past family history, past medical history, past social history, past surgical history and problem list.   Objective:   Vitals:   03/28/20 0904  BP: 101/63  Pulse: 77  Weight: 180 lb (81.6 kg)    Fetal Status: Fetal Heart Rate (bpm): 132 Fundal Height: 37 cm Movement: Present  Presentation: Vertex  General:  Alert, oriented and cooperative. Patient is in no acute distress.  Skin: Skin is warm and dry. No rash noted.   Cardiovascular: Normal heart rate noted  Respiratory: Normal respiratory effort, no problems with respiration noted  Abdomen: Soft, gravid, appropriate for gestational age.  Pain/Pressure: Present     Pelvic: Cervical exam performed in the presence of a chaperone Dilation: 2 Effacement (%): 50 Station: -3  Extremities: Normal range of motion.  Edema: Trace  Mental Status: Normal mood and affect. Normal behavior. Normal judgment and thought content.   Assessment and Plan:  Pregnancy: G3P2002 at [redacted]w[redacted]d  Vertex position today: confirmed by bimanual exam and bedside US  done by Mervyn Gay, RN GBS negative    Term labor symptoms and general obstetric precautions including but not limited to vaginal bleeding, contractions, leaking of fluid and fetal movement were reviewed in detail with the patient. Please refer to After Visit Summary for other counseling recommendations.   No follow-ups on file.  Future Appointments  Date Time Provider Department Center  04/03/2020  1:40 PM Sharyon Cable, CNM [facility]  04/10/2020  2:00 PM Sharyon Cable, CNM [facility]    Venia Carbon, NP

## 2020-04-03 ENCOUNTER — Telehealth (INDEPENDENT_AMBULATORY_CARE_PROVIDER_SITE_OTHER): Payer: BC Managed Care – PPO | Admitting: Certified Nurse Midwife

## 2020-04-03 DIAGNOSIS — O43192 Other malformation of placenta, second trimester: Secondary | ICD-10-CM

## 2020-04-03 DIAGNOSIS — Z3480 Encounter for supervision of other normal pregnancy, unspecified trimester: Secondary | ICD-10-CM

## 2020-04-03 DIAGNOSIS — O99013 Anemia complicating pregnancy, third trimester: Secondary | ICD-10-CM

## 2020-04-03 DIAGNOSIS — D509 Iron deficiency anemia, unspecified: Secondary | ICD-10-CM

## 2020-04-03 NOTE — Progress Notes (Signed)
OBSTETRICS PRENATAL VIRTUAL VISIT ENCOUNTER NOTE  Provider location: Center for [facility] at [place]   I connected with Kathy Nichols on 04/03/20 at  1:50 PM EDT by MyChart Video Encounter at home and verified that I am speaking with the correct person using two identifiers.   I discussed the limitations, risks, security and privacy concerns of performing an evaluation and management service virtually and the availability of in person appointments. I also discussed with the patient that there may be a patient responsible charge related to this service. The patient expressed understanding and agreed to proceed. Subjective:  Chistina Nichols is a 34 y.o. G3P2002 at [redacted]w[redacted]d being seen today for ongoing prenatal care.  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  Patient reports no complaints.  Contractions: Irritability. Vag. Bleeding: None.  Movement: Present. Denies any leaking of fluid.   The following portions of the patient's history were reviewed and updated as appropriate: allergies, current medications, past family history, past medical history, past social history, past surgical history and problem list.   Objective:  There were no vitals filed for this visit.  Fetal Status:     Movement: Present     General:  Alert, oriented and cooperative. Patient is in no acute distress.  Respiratory: Normal respiratory effort, no problems with respiration noted  Mental Status: Normal mood and affect. Normal behavior. Normal  judgment and thought content.  Rest of physical exam deferred due to type of encounter  Imaging: [place] MFM OB FOLLOW UP  Result Date: 03/14/2020 ----------------------------------------------------------------------  OBSTETRICS REPORT                       (Signed Final 03/14/2020 01:05 pm) ---------------------------------------------------------------------- Patient Info  ID #:       [id number]                          D.O.B.:  1986-08-06 (33 yrs)  Name:       Kathy Nichols                    Visit Date: 03/14/2020 10:03 am ---------------------------------------------------------------------- Performed By  Performed By:     Percell Boston          Secondary Phy.:   [facility]                    RDMS  Attending:        Ma Rings MD         Address:          [phone] [place], [place]  Referred By:      Dorathy Kinsman         [facility]  [facility]                    CNM                                      Fetal Care  Ref. Address:     [address]                    [place]                    [id number] ---------------------------------------------------------------------- Orders   #  Description                          Code         Ordered By   1  [place] MFM OB FOLLOW UP                  27741.28     Lin Landsman  ----------------------------------------------------------------------   #  Order #                    Accession #                 Episode #   1  [id number]                  [id number]                  [id number]  ---------------------------------------------------------------------- Indications   Marginal insertion of umbilical cord affecting O43.192   management of mother in second trimester   [redacted] weeks gestation of pregnancy                Z3A.35   Family history of genetic disorder (Brother    Z84.89   has Angelman syndrome)   Low  risk NIPS, abnormal AFP Tetra -   increased risk for Down synd   Encounter for other antenatal screening        Z36.2   follow-up  ---------------------------------------------------------------------- Fetal Evaluation  Num Of Fetuses:         1  Fetal Heart Rate(bpm):  141  Cardiac Activity:       Observed  Presentation:           Transverse, head to maternal left  Placenta:               Anterior  P. Cord Insertion:      Previously Visualized  Amniotic Fluid  AFI FV:      Within normal limits  AFI Sum(cm)     %Tile       Largest Pocket(cm)  15.84           58          5.24  RUQ(cm)       RLQ(cm)       LUQ(cm)  LLQ(cm)  5.24          2.79          5.11           2.7 ---------------------------------------------------------------------- Biometry  BPD:      85.1  mm     G. Age:  34w 2d         20  %    CI:        77.01   %    70 - 86                                                          FL/HC:      21.8   %    20.1 - 22.1  HC:      307.1  mm     G. Age:  34w 2d          3  %    HC/AC:      0.97        0.93 - 1.11  AC:      317.3  mm     G. Age:  35w 5d         61  %    FL/BPD:     78.8   %    71 - 87  FL:       67.1  mm     G. Age:  34w 4d         19  %    FL/AC:      21.1   %    20 - 24  Est. FW:    2582  gm    5 lb 11 oz      34  % ---------------------------------------------------------------------- OB History  Gravidity:    3         Term:   2        Prem:   0        SAB:   0  TOP:          0       Ectopic:  0        Living: 2 ---------------------------------------------------------------------- Gestational Age  LMP:           35w 4d        Date:  07/09/19                 EDD:   04/14/20  U/S Today:     34w 5d                                        EDD:   04/20/20  Best:          35w 4d     Det. By:  LMP  (07/09/19)          EDD:   04/14/20 ---------------------------------------------------------------------- Anatomy  Cranium:               Appears normal         LVOT:                   Previously  seen  Cavum:  Previously seen        Aortic Arch:            Previously seen  Ventricles:            Appears normal         Ductal Arch:            Previously seen  Choroid Plexus:        Previously seen        Diaphragm:              Previously seen  Cerebellum:            Previously seen        Stomach:                Appears normal, left                                                                        sided  Posterior Fossa:       Previously seen        Abdomen:                Previously seen  Nuchal Fold:           Not applicable (>20    Abdominal Wall:         Previously seen                         wks GA)  Face:                  Orbits and profile     Cord Vessels:           Previously seen                         previously seen  Lips:                  Previously seen        Kidneys:                Appear normal  Palate:                Previously seen        Bladder:                Appears normal  Thoracic:              Appears normal         Spine:                  Previously seen  Heart:                 Previously seen        Upper Extremities:      Previously seen  RVOT:                  Previously seen        Lower Extremities:      Previously seen  Other:  Female gender. Nasal bone previously visualized. ---------------------------------------------------------------------- Cervix Uterus Adnexa  Cervix  Not visualized (advanced GA >24wks)  Uterus  No abnormality visualized.  Left Ovary  No adnexal mass visualized.  Right Ovary  No adnexal mass visualized.  Cul De Sac  No free fluid seen.  Adnexa  No abnormality visualized. ---------------------------------------------------------------------- Comments  This patient was seen for a follow up growth scan due to a  marginal placental cord insertion that was noted during her  earlier ultrasound exams.  She denies any problems since  her last exam.  She was informed that the fetal growth and amniotic fluid  level appears appropriate for  her gestational age.  Follow-up as indicated. ----------------------------------------------------------------------                   Johnell Comings, MD Electronically Signed Final Report   03/14/2020 01:05 pm ----------------------------------------------------------------------   Assessment and Plan:  Pregnancy: [id number] at [redacted]w[redacted]d  1. Supervision of other normal pregnancy, antepartum - patient has not taken BP today but plans to take this afternoon and enter into babyscripts app  - patient was breech, confirmed vertex last week by bedside US and bimanual examination  - Routine prenatal care - Anticipatory guidance on upcoming appointments  - Patient does not want to be scheduled for induction at this time, will continue to have discussion at next appointment for postdates pregnancy   2. Maternal iron deficiency anemia complicating pregnancy, third trimester - continue medication as prescribed   3. 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 I discussed the assessment and treatment plan with the patient. The patient was provided an opportunity to ask questions and all were answered. The patient agreed with the plan and demonstrated an understanding of the instructions. The patient was advised to call back or seek an in-person office evaluation/go to MAU at [facility] for any urgent or concerning symptoms. Please refer to After Visit Summary for other counseling recommendations.   I provided 13 minutes of face-to-face time during this encounter.   Future Appointments  Date Time Provider [place]  04/10/2020  2:00 PM Lajean Manes, CNM [place], [place] for [employer], [place]

## 2020-04-07 ENCOUNTER — Inpatient Hospital Stay (HOSPITAL_COMMUNITY)
Admission: AD | Admit: 2020-04-07 | Discharge: 2020-04-08 | DRG: 807 | Disposition: A | Discharge status: Home / Self Care | Payer: BC Managed Care – PPO | Attending: Obstetrics and Gynecology | Admitting: Obstetrics and Gynecology

## 2020-04-07 ENCOUNTER — Other Ambulatory Visit: Payer: Self-pay

## 2020-04-07 ENCOUNTER — Encounter (HOSPITAL_COMMUNITY): Payer: Self-pay | Admitting: Obstetrics and Gynecology

## 2020-04-07 DIAGNOSIS — O285 Abnormal chromosomal and genetic finding on antenatal screening of mother: Secondary | ICD-10-CM | POA: Diagnosis present

## 2020-04-07 DIAGNOSIS — O9902 Anemia complicating childbirth: Secondary | ICD-10-CM | POA: Diagnosis present

## 2020-04-07 DIAGNOSIS — Z20822 Contact with and (suspected) exposure to covid-19: Secondary | ICD-10-CM | POA: Diagnosis present

## 2020-04-07 DIAGNOSIS — Z3A39 39 weeks gestation of pregnancy: Secondary | ICD-10-CM | POA: Diagnosis not present

## 2020-04-07 DIAGNOSIS — O43123 Velamentous insertion of umbilical cord, third trimester: Secondary | ICD-10-CM | POA: Diagnosis present

## 2020-04-07 DIAGNOSIS — O99013 Anemia complicating pregnancy, third trimester: Secondary | ICD-10-CM | POA: Diagnosis present

## 2020-04-07 DIAGNOSIS — D509 Iron deficiency anemia, unspecified: Secondary | ICD-10-CM | POA: Diagnosis present

## 2020-04-07 DIAGNOSIS — O26893 Other specified pregnancy related conditions, third trimester: Secondary | ICD-10-CM | POA: Diagnosis present

## 2020-04-07 DIAGNOSIS — O43192 Other malformation of placenta, second trimester: Secondary | ICD-10-CM

## 2020-04-07 DIAGNOSIS — Z8489 Family history of other specified conditions: Secondary | ICD-10-CM

## 2020-04-07 HISTORY — DX: Separation of muscle (nontraumatic), other site: M62.08

## 2020-04-07 HISTORY — DX: Iron deficiency anemia, unspecified: D50.9

## 2020-04-07 LAB — CBC
HCT: 41.1 % (ref 36.0–46.0)
Hemoglobin: 12.9 g/dL (ref 12.0–15.0)
MCH: 25 pg — ABNORMAL LOW (ref 26.0–34.0)
MCHC: 31.4 g/dL (ref 30.0–36.0)
MCV: 79.5 fL — ABNORMAL LOW (ref 80.0–100.0)
Platelets: 208 10*3/uL (ref 150–400)
RBC: 5.17 MIL/uL — ABNORMAL HIGH (ref 3.87–5.11)
RDW: 17.3 % — ABNORMAL HIGH (ref 11.5–15.5)
WBC: 7.5 10*3/uL (ref 4.0–10.5)
nRBC: 0 % (ref 0.0–0.2)

## 2020-04-07 LAB — RPR: RPR Ser Ql: NONREACTIVE

## 2020-04-07 LAB — TYPE AND SCREEN
ABO/RH(D): O POS
Antibody Screen: NEGATIVE

## 2020-04-07 LAB — RESPIRATORY PANEL BY RT PCR (FLU A&B, COVID)
Influenza A by PCR: NEGATIVE
Influenza B by PCR: NEGATIVE
SARS Coronavirus 2 by RT PCR: NEGATIVE

## 2020-04-07 LAB — ABO/RH: ABO/RH(D): O POS

## 2020-04-07 MED ORDER — OXYTOCIN 40 UNITS IN NORMAL SALINE INFUSION - SIMPLE MED
INTRAVENOUS | Status: AC
  Filled 2020-04-07: qty 1000

## 2020-04-07 MED ORDER — BENZOCAINE-MENTHOL 20-0.5 % EX AERO
1.0000 "application " | INHALATION_SPRAY | CUTANEOUS | Status: DC | PRN
  Administered 2020-04-07: 1 via TOPICAL
  Filled 2020-04-07: qty 56

## 2020-04-07 MED ORDER — OXYTOCIN 10 UNIT/ML IJ SOLN
INTRAMUSCULAR | Status: AC
  Administered 2020-04-07: 10 [IU]
  Filled 2020-04-07: qty 1

## 2020-04-07 MED ORDER — TETANUS-DIPHTH-ACELL PERTUSSIS 5-2.5-18.5 LF-MCG/0.5 IM SUSP: 0.5000 mL | Freq: Once | INTRAMUSCULAR | Status: DC

## 2020-04-07 MED ORDER — IBUPROFEN 600 MG PO TABS
600.0000 mg | ORAL_TABLET | Freq: Four times a day (QID) | ORAL | Status: DC
  Administered 2020-04-07 – 2020-04-08 (×5): 600 mg via ORAL
  Filled 2020-04-07 (×5): qty 1

## 2020-04-07 MED ORDER — COCONUT OIL OIL
1.0000 "application " | TOPICAL_OIL | Status: DC | PRN
  Administered 2020-04-07: 1 via TOPICAL

## 2020-04-07 MED ORDER — DIBUCAINE (PERIANAL) 1 % EX OINT: 1.0000 "application " | TOPICAL_OINTMENT | CUTANEOUS | Status: DC | PRN

## 2020-04-07 MED ORDER — LIDOCAINE HCL (PF) 1 % IJ SOLN: 30.0000 mL | Freq: Once | INTRAMUSCULAR | Status: AC

## 2020-04-07 MED ORDER — ACETAMINOPHEN 325 MG PO TABS: 650.0000 mg | ORAL_TABLET | ORAL | Status: DC | PRN

## 2020-04-07 MED ORDER — LACTATED RINGERS IV SOLN: INTRAVENOUS | Status: DC

## 2020-04-07 MED ORDER — SIMETHICONE 80 MG PO CHEW: 80.0000 mg | CHEWABLE_TABLET | ORAL | Status: DC | PRN

## 2020-04-07 MED ORDER — LIDOCAINE HCL (PF) 1 % IJ SOLN
INTRAMUSCULAR | Status: AC
  Administered 2020-04-07: 30 mL
  Filled 2020-04-07: qty 30

## 2020-04-07 MED ORDER — OXYCODONE HCL 5 MG PO TABS: 5.0000 mg | ORAL_TABLET | ORAL | Status: DC | PRN

## 2020-04-07 MED ORDER — ZOLPIDEM TARTRATE 5 MG PO TABS: 5.0000 mg | ORAL_TABLET | Freq: Every evening | ORAL | Status: DC | PRN

## 2020-04-07 MED ORDER — DIPHENHYDRAMINE HCL 25 MG PO CAPS: 25.0000 mg | ORAL_CAPSULE | Freq: Four times a day (QID) | ORAL | Status: DC | PRN

## 2020-04-07 MED ORDER — SENNOSIDES-DOCUSATE SODIUM 8.6-50 MG PO TABS
2.0000 | ORAL_TABLET | ORAL | Status: DC
  Filled 2020-04-07: qty 2

## 2020-04-07 MED ORDER — OXYCODONE HCL 5 MG PO TABS: 10.0000 mg | ORAL_TABLET | ORAL | Status: DC | PRN

## 2020-04-07 MED ORDER — PRENATAL MULTIVITAMIN CH
1.0000 | ORAL_TABLET | Freq: Every day | ORAL | Status: DC
  Administered 2020-04-08: 1 via ORAL
  Filled 2020-04-07: qty 1

## 2020-04-07 MED ORDER — ONDANSETRON HCL 4 MG/2ML IJ SOLN: 4.0000 mg | INTRAMUSCULAR | Status: DC | PRN

## 2020-04-07 MED ORDER — TERBUTALINE SULFATE 1 MG/ML IJ SOLN: 0.2500 mg | Freq: Once | INTRAMUSCULAR | Status: DC

## 2020-04-07 MED ORDER — ONDANSETRON HCL 4 MG PO TABS: 4.0000 mg | ORAL_TABLET | ORAL | Status: DC | PRN

## 2020-04-07 MED ORDER — LACTATED RINGERS IV BOLUS
1000.0000 mL | Freq: Once | INTRAVENOUS | Status: AC
  Administered 2020-04-07: 1000 mL via INTRAVENOUS

## 2020-04-07 MED ORDER — WITCH HAZEL-GLYCERIN EX PADS: 1.0000 "application " | MEDICATED_PAD | CUTANEOUS | Status: DC | PRN

## 2020-04-07 NOTE — Progress Notes (Signed)
Pt arrived to MAU, EFM applied and FHT in the 130's with audible variables. CNM at bedside, pt 10 cm, membranes intact. Pt to room 204.

## 2020-04-07 NOTE — Lactation Note (Signed)
This note was copied from a baby's chart. Lactation Consultation Note  Patient Name: Kathy Nichols WCBJS'E Date: 04/07/2020 Reason for consult: Initial assessment;Term  2152 - 2212 - I conducted an initial consult with Kathy Nichols. She states that baby "Zainah" latched in recovery but has been too sleepy to latch since that time. Pecola Leisure is now 91 hours old. Mom was holding her STS upon entry. Kathy Nichols is a multip with experience breast feeding. She described many methods of trying to wake baby to eat today but felt unsuccessful.  I offered to assist. I put baby into the bassinet and removed her blanket. I reviewed hand expression with Kathy Nichols, and we noted colostrum. Baby woke up a bit, and we latched her to the left breast. I showed Kathy Nichols how to grasp breast in a U hold. Baby needed stimulation to stay active, and I showed Kathy Nichols how to gently tug baby's chin to make latch more comfortable.  Baby fed 6 minutes and then went to sleep. We discussed continuing to offer the breast and to feed baby EBM by finger first to help stimulate her to wake up and eat.  Kathy Nichols is aware that we have pumping options and may elect to pump tonight if baby continues to be sleepy. She will continue to offer the breast and try to wake baby up.  I recommended feeding 8-12 times a day on demand. Baby has had one wet and one dirty diaper today.  I reviewed my visit details with RN assigned to room Helmut Muster) and asked if she could check in on Kathy Nichols tonight to assist with waking baby to latch.  Maternal Data Formula Feeding for Exclusion: No Has patient been taught Hand Expression?: Yes Does the patient have breastfeeding experience prior to this delivery?: Yes  Feeding Feeding Type: Breast Fed  LATCH Score Latch: Repeated attempts needed to sustain latch, nipple held in mouth throughout feeding, stimulation needed to elicit sucking reflex.  Audible Swallowing: A few with stimulation  Type of  Nipple: Everted at rest and after stimulation  Comfort (Breast/Nipple): Soft / non-tender  Hold (Positioning): Assistance needed to correctly position infant at breast and maintain latch.  LATCH Score: 7  Interventions Interventions: Breast feeding basics reviewed;Assisted with latch;Skin to skin;Hand express;Support pillows;Adjust position  Consult Status Consult Status: Follow-up Date: 04/08/20 Follow-up type: In-patient    Walker Shadow 04/07/2020, 10:21 PM

## 2020-04-07 NOTE — Discharge Instructions (Signed)

## 2020-04-07 NOTE — H&P (Signed)
OBSTETRIC ADMISSION HISTORY AND PHYSICAL  Kathy Nichols is a 34 y.o. female G3P2002 with IUP at [redacted]w[redacted]d presenting for SOL. She reports +FMs. No LOF, VB, blurry vision, headaches, peripheral edema, or RUQ pain. She plans on breastfeeding. She requests outpatient IUD for birth control.  Dating: By LMP --->  Estimated Date of Delivery: 04/14/20  Sono:    @[redacted]w[redacted]d , normal anatomy, transverse presentation, 2582g, 34%ile, EFW 5#11   Prenatal History/Complications: Family history of genetic disease (Brother with Angelman Syndrome) Marginal cord insertion Iron deficiency anemia  Past Medical History: Past Medical History:  Diagnosis Date  . Migraine     Past Surgical History: Past Surgical History:  Procedure Laterality Date  . TONSILLECTOMY    . WISDOM TOOTH EXTRACTION      Obstetrical History: OB History    Gravida  3   Para  2   Term  2   Preterm      AB      Living  2     SAB      TAB      Ectopic      Multiple  0   Live Births  2           Social History: Social History   Socioeconomic History  . Marital status: Married    Spouse name: Not on file  . Number of children: Not on file  . Years of education: Not on file  . Highest education level: Not on file  Occupational History  . Not on file  Tobacco Use  . Smoking status: Never Smoker  . Smokeless tobacco: Never Used  Substance and Sexual Activity  . Alcohol use: No    Alcohol/week: 0.0 standard drinks  . Drug use: No  . Sexual activity: Yes    Partners: Male    Birth control/protection: None  Other Topics Concern  . Not on file  Social History Narrative  . Not on file   Social Determinants of Health   Financial Resource Strain:   . Difficulty of Paying Living Expenses:   Food Insecurity:   . Worried About Charity fundraiser in the Last Year:   . Arboriculturist in the Last Year:   Transportation Needs:   . Film/video editor (Medical):   Marland Kitchen Lack of Transportation  (Non-Medical):   Physical Activity:   . Days of Exercise per Week:   . Minutes of Exercise per Session:   Stress:   . Feeling of Stress :   Social Connections:   . Frequency of Communication with Friends and Family:   . Frequency of Social Gatherings with Friends and Family:   . Attends Religious Services:   . Active Member of Clubs or Organizations:   . Attends Archivist Meetings:   Marland Kitchen Marital Status:     Family History: Family History  Problem Relation Age of Onset  . Diabetes Maternal Grandmother   . Thyroid disease Maternal Grandmother   . Diabetes Paternal Grandmother   . Thyroid disease Mother   . Thyroid disease Brother   . Hypertension Father   . Hyperlipidemia Father   . Glaucoma Paternal Grandfather   . Diabetes Paternal Grandfather     Allergies: Allergies  Allergen Reactions  . Silicone Rash    Medications Prior to Admission  Medication Sig Dispense Refill Last Dose  . ascorbic acid (VITAMIN C) 500 MG tablet Take 1 tablet (500 mg total) by mouth daily. 30 tablet 2   .  docusate sodium (COLACE) 100 MG capsule Take 1 capsule (100 mg total) by mouth 2 (two) times daily as needed. 30 capsule 2   . ferrous sulfate (FERROUSUL) 325 (65 FE) MG tablet Take 1 tablet (325 mg total) by mouth 2 (two) times daily. 60 tablet 1   . iron polysaccharides (NIFEREX) 150 MG capsule Take 1 capsule (150 mg total) by mouth daily. 30 capsule 2   . Prenatal Vit-Fe Fumarate-FA (PRENATAL MULTIVITAMIN) TABS tablet Take 1 tablet by mouth daily at 12 noon.        Review of Systems:  All systems reviewed and negative except as stated in HPI  PE: Blood pressure 107/64, pulse 75, temperature 97.8 F (36.6 C), temperature source Oral, resp. rate 18, last menstrual period 07/09/2019, currently breastfeeding. General appearance: alert and cooperative, breathing through contractions Lungs: regular rate and effort Heart: regular rate  Abdomen: soft, non-tender Extremities:  Homans sign is negative, no sign of DVT Presentation: cephalic EFM: not on monitor Toco: not on monitor Dilation: 10 Exam by:: emly cnm  Prenatal labs: ABO, Rh: O/RH(D) POSITIVE/-- (10/13 8295) Antibody: NO ANTIBODIES DETECTED (10/13 0922) Rubella: 3.96 (10/13 6213) RPR: NON-REACTIVE (02/08 0901)  HBsAg: NON-REACTIVE (10/13 0865)  HIV: NON-REACTIVE (02/08 0901)  GBS:   negative 2 hr GTT normal  Prenatal Transfer Tool  Maternal Diabetes: No Genetic Screening: increased DSR by quad 1:332 Maternal Ultrasounds/Referrals: Normal Fetal Ultrasounds or other Referrals:  Referred to Materal Fetal Medicine  Maternal Substance Abuse:  No Significant Maternal Medications:  None Significant Maternal Lab Results: Group B Strep negative  No results found for this or any previous visit (from the past 24 hour(s)).  Patient Active Problem List   Diagnosis Date Noted  . Normal labor and delivery 04/07/2020  . Maternal iron deficiency anemia complicating pregnancy, third trimester 02/13/2020  . Suspected fetal abnormality affecting management of mother, antepartum 12/16/2019  . 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 Abnormal chromosomal and genetic finding on antenatal screening mother 11/21/2019  . 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 Family history of genetic disease 10/20/2019  . Supervision of other normal pregnancy, antepartum 09/20/2019    Assessment: Kathy Nichols is a 34 y.o. G3P2002 at [redacted]w[redacted]d here for SOL  1. Labor: expectant management 2. FWB: No tracings recorded 3. Pain: per patient request 4. GBS: negative   Plan: Admit to L&D, anticipate precipitous delivery  Lorain Keast L Cayle Cordoba, DO  04/07/2020, 8:23 AM

## 2020-04-07 NOTE — MAU Note (Signed)
Kathy Nichols is a 33 y.o. at [redacted]w[redacted]d here in MAU reporting: contractions that started around 0615. They are now less than 5 min. No LOF, no bleeding.  Onset of complaint: today  Lab orders placed from triage: mau labor

## 2020-04-07 NOTE — Discharge Summary (Signed)
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 yo G3P2002 at 379w0das admitted in Active Labor on 04/07/2020. Patient had an uncomplicated labor course as follows:  Membrane Rupture Time/Date: 7:30 AM ,04/07/2020   Intrapartum Procedures: Episiotomy: None [1]                                         Lacerations:  2nd degree [3]  Patient had a delivery of a Viable infant. 04/07/2020  Information for the patient's newborn:  KhVonya, Ohalloran0[[id number]]Delivery Method: Vaginal, Spontaneous(Filed from Delivery Summary)     Pateint had an uncomplicated postpartum course.  She is ambulating, tolerating a regular diet, passing flatus, and urinating well. Patient is discharged home in stable condition on 04/08/20.  Delivery time: 7:30 AM    Magnesium Sulfate received: No BMZ received: No Rhophylac:N/A MMR:N/A Transfusion:No  Physical exam  Vitals:   04/07/20 1628 04/07/20 2015 04/07/20 2329 04/08/20 0628  BP: 109/70 98/69 115/74 102/72  Pulse: 79 71 67 64   Resp: _0 Temp: 98.1 F (36.7 C) 98.4 F (36.9 C) (!) 97.4 F (36.3 C) 97.9 F (36.6 C)  TempSrc: Oral Oral Oral Oral  SpO2: 99% 100% 99% 99%  Weight:      Height:       General: alert, cooperative and no distress Lochia: appropriate Uterine Fundus: firm Incision: N/A DVT Evaluation: Negative Homan's sign. Labs: Lab Results  Component Value Date   WBC 8.0 04/08/2020   HGB 10.7 (L) 04/08/2020   HCT 34.1 (L) 04/08/2020   MCV 81.6 04/08/2020   PLT 162 04/08/2020   No flowsheet data found. Edinburgh Score: Edinburgh Postnatal Depression Scale Screening Tool 04/08/2020  I have been able to laugh and see the funny side of things. (No Data)    Discharge instruction: per After Visit Summary and "Baby and Me Booklet".  After visit meds:  Allergies as of 04/08/2020      Reactions   Silicone Rash      Medication  List    STOP taking these medications   ferrous sulfate 325 (65 FE) MG tablet Commonly known as: FerrouSul   iron polysaccharides 150 MG capsule Commonly known as: NIFEREX     TAKE these medications   ascorbic acid 500 MG tablet Commonly known as: VITAMIN C Take 1 tablet (500 mg total) by mouth daily.   docusate sodium 100 MG capsule Commonly known as: COLACE Take 1 capsule (100 mg total) by mouth 2 (two) times daily as needed.   ibuprofen 600 MG tablet Commonly known as: ADVIL Take 1 tablet (600 mg total) by mouth every 6 (six) hours.   prenatal multivitamin Tabs tablet Take 1 tablet by mouth daily at 12 noon.      Diet: routine diet  Activity: Advance as tolerated. Pelvic rest for 6 weeks.   Outpatient follow up:6 weeks Follow up Appt: Future Appointments  Date Time Provider [place]  04/10/2020  2:00 PM Lajean Manes, [place] [facility]   Follow up Visit: [place] for [place] at [facility] Follow up in 4 week(s).   Specialty: Obstetrics and Gynecology Why: postpartum  visit Contact information: [place], [address] [place] [phone]            Please schedule this patient for Postpartum visit in: 6 weeks with the following provider: Any provider In-Person For C/S patients schedule nurse incision check in weeks 2 weeks: no Low risk pregnancy complicated by: Marginal cord insertion, iron deficiency anemia, suspected fetal abnormality Delivery mode:  SVD Anticipated Birth Control:  IUD at post partum visit PP Procedures needed: IUD insertion (paragard vs Liletta)  Schedule Integrated BH visit: no     Newborn Data: Live born female  Birth Weight:  7lb 0 oz APGAR: 54, 9  Newborn Delivery   Birth date/time: 04/07/2020 07:30:00 Delivery type: Vaginal, Spontaneous      Baby Feeding: Breast Disposition:home with mother   04/08/2020 Manya Silvas, CNM

## 2020-04-08 LAB — CBC
HCT: 34.1 % — ABNORMAL LOW (ref 36.0–46.0)
Hemoglobin: 10.7 g/dL — ABNORMAL LOW (ref 12.0–15.0)
MCH: 25.6 pg — ABNORMAL LOW (ref 26.0–34.0)
MCHC: 31.4 g/dL (ref 30.0–36.0)
MCV: 81.6 fL (ref 80.0–100.0)
Platelets: 162 10*3/uL (ref 150–400)
RBC: 4.18 MIL/uL (ref 3.87–5.11)
RDW: 17.6 % — ABNORMAL HIGH (ref 11.5–15.5)
WBC: 8 10*3/uL (ref 4.0–10.5)
nRBC: 0 % (ref 0.0–0.2)

## 2020-04-08 MED ORDER — IBUPROFEN 600 MG PO TABS: 600.0000 mg | ORAL_TABLET | Freq: Four times a day (QID) | ORAL | 0 refills | Status: AC

## 2020-04-08 NOTE — Lactation Note (Signed)
This note was copied from a baby's chart. Lactation Consultation Note: Mother reports that her nipples feel much better today. She reports that she had a crack on the left nipple. Observed and only a tiny pink mark where mother points her finger on the left nipple.   Discussed S/S of Mastitis. Mother reports that she has Mastitis with first child.  Advised mother to keep milk freely moving and breast soft. Discussed treatment and prevention of engorgement.   Mother reports that infant is feeding well.  Mother to continue to cue base feed infant and feed at least 8-12 times or more in 24 hours and advised to allow for cluster feeding infant as needed.   Mother to continue to due STS. Mother is aware of available LC services at [facility], BFSG'S, OP Dept, and phone # for questions or concerns about breastfeeding.  Mother receptive to all teaching and plan of care.    Patient Name: Kathy Nichols DGREU'X Date: 04/08/2020 Reason for consult: Follow-up assessment   Maternal Data    Feeding Feeding Type: Breast Fed  LATCH Score                   Interventions    Lactation Tools Discussed/Used     Consult Status Consult Status: Complete    Michel Bickers 04/08/2020, 11:50 AM

## 2020-04-10 ENCOUNTER — Encounter: Payer: BC Managed Care – PPO | Admitting: Certified Nurse Midwife

## 2020-04-14 ENCOUNTER — Inpatient Hospital Stay (HOSPITAL_COMMUNITY): Admission: AD | Admit: 2020-04-14 | Payer: BC Managed Care – PPO | Source: Home / Self Care

## 2020-04-24 ENCOUNTER — Encounter: Payer: Self-pay | Admitting: *Deleted

## 2020-05-21 ENCOUNTER — Ambulatory Visit (INDEPENDENT_AMBULATORY_CARE_PROVIDER_SITE_OTHER): Payer: BC Managed Care – PPO | Admitting: Obstetrics and Gynecology

## 2020-05-21 ENCOUNTER — Encounter: Payer: Self-pay | Admitting: Obstetrics and Gynecology

## 2020-05-21 ENCOUNTER — Other Ambulatory Visit: Payer: Self-pay

## 2020-05-21 DIAGNOSIS — O99345 Other mental disorders complicating the puerperium: Secondary | ICD-10-CM

## 2020-05-21 DIAGNOSIS — F53 Postpartum depression: Secondary | ICD-10-CM

## 2020-05-21 DIAGNOSIS — Z1332 Encounter for screening for maternal depression: Secondary | ICD-10-CM | POA: Diagnosis not present

## 2020-05-21 DIAGNOSIS — M545 Low back pain: Secondary | ICD-10-CM

## 2020-05-21 DIAGNOSIS — G8929 Other chronic pain: Secondary | ICD-10-CM

## 2020-05-21 NOTE — Progress Notes (Signed)
Obstetrics/Postpartum Visit  Appointment Date: 05/21/2020  OBGYN Clinic: [facility]  Primary Care Provider: Patient, No Pcp Per  Chief Complaint:  Chief Complaint  Patient presents with  . Postpartum Care    History of Present Illness: Kathy Nichols is a 34 y.o. Asian [id number] (Patient's last menstrual period was 07/09/2019.), seen for the above chief complaint. Her past medical history is significant for migraines.   Kathy Nichols is a 34 y.o. [id number] female who presents for a postpartum visit. She is 6 weeks postpartum following a normal spontaneous vaginal delivery with 2nd degree laceration.  I have fully reviewed the prenatal and intrapartum course. The delivery was at 39 gestational weeks.  Anesthesia: none. Postpartum course has been unremarkable. Baby is doing well. Baby is feeding by breast. Bleeding no bleeding. Bowel function is normal. Bladder function is normal. Patient is not sexually active. Contraception method is none. Postpartum depression screening: positive (score 15) .  Pregnancy complicated by question of VSD with normal fetal echo.  Complains of back pain, requesting referral to PT. No libido, declines contraception. Tired and stressed out about traveling for wedding at end of June.  Pap smear: no abnormalities (date: 09/2019)  Review of Systems: Positive for n/a.   Her 12 point review of systems is negative or as noted in the History of Present Illness.  Patient Active Problem List   Diagnosis Date Noted  . Normal labor and delivery 04/07/2020  . Normal labor 04/07/2020  . 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 Family history of genetic disease 10/20/2019  . Supervision of other normal pregnancy, antepartum 09/20/2019    Medications Kathy Nichols had no medications administered during this visit. Current Outpatient Medications  Medication Sig Dispense Refill  . ibuprofen (ADVIL) 600 MG tablet Take 1 tablet (600 mg total) by mouth every 6 (six) hours. 30  tablet 0  . Prenatal Vit-Fe Fumarate-FA (PRENATAL MULTIVITAMIN) TABS tablet Take 1 tablet by mouth daily at 12 noon.    Marland Kitchen ascorbic acid (VITAMIN C) 500 MG tablet Take 1 tablet (500 mg total) by mouth daily. 30 tablet 2  . docusate sodium (COLACE) 100 MG capsule Take 1 capsule (100 mg total) by mouth 2 (two) times daily as needed. 30 capsule 2   No current facility-administered medications for this visit.    Allergies Silicone  Physical Exam:  BP (!) 100/59   Pulse 67   Ht 5\' 2"  (1.575 m)   Wt 166 lb (75.3 kg)   LMP 07/09/2019   BMI 30.36 kg/m  Body mass index is 30.36 kg/m. General appearance: Well nourished, well developed female in no acute distress.  Cardiovascular: regular rate and rhythm Respiratory:   Normal respiratory effort Abdomen: non distended Breasts: not examined. Neuro/Psych:  Normal mood and affect.  Skin:  Warm and dry.    PP Depression Screening:    Edinburgh Postnatal Depression Scale - 05/21/20 0839      Edinburgh Postnatal Depression Scale:  In the Past 7 Days   I have been able to laugh and see the funny side of things. 0    I have looked forward with enjoyment to things. 3    I have blamed myself unnecessarily when things went wrong. 0    I have been anxious or worried for no good reason. 3    I have felt scared or panicky for no good reason. 2    Things have been getting on top of me. 2    I have been so unhappy  that I have had difficulty sleeping. 0    I have felt sad or miserable. 2    I have been so unhappy that I have been crying. 2    The thought of harming myself has occurred to me. 1    Edinburgh Postnatal Depression Scale Total 15           Assessment: Patient is a 34 y.o. [id number] who is 6 weeks post partum from a SVD. She is doing okay, with some moodiness. Accepts referral to [facility].   Plan:   1. Postpartum state Doing well  2. Chronic bilateral low back pain, unspecified whether sciatica present - Ambulatory referral to Physical  Therapy  3. Postpartum depression Denies SI/HI, just needs sleep Stressed about needing to travel for wedding at end of June - Ambulatory referral to [facility]  Essential components of care per ACOG recommendations:  1.  Mood and well being: Patient with positive depression screening today. Reviewed local resources for support. Referral to [facility] today. - Patient does not use tobacco.  - hx of drug use? No    2. Infant care and feeding:  -Patient currently breastmilk feeding? Yes  -Social determinants of health (SDOH) reviewed in EPIC. No concerns  3. Sexuality, contraception and birth spacing - Patient does not want a pregnancy in the next year.  Desired family size is 3 children.  - Reviewed forms of contraception in tiered fashion. Patient desired no method today (abstinence)    4. Sleep and fatigue -Encouraged family/partner/community support of 4 hrs of uninterrupted sleep to help with mood and fatigue  5. Physical Recovery  - Discussed patients delivery and complications - Patient had a 2nd degree laceration, perineal healing reviewed. Patient expressed understanding - Patient has urinary incontinence? No  - Patient is safe to resume physical and sexual activity  6.  Health Maintenance - Last pap smear done 09/2019 and was normal with negative HPV.   RTC 1 yr or prn   K. Arvilla Meres, M.D. Attending Center for [employer] manager)

## 2020-05-21 NOTE — Patient Instructions (Signed)
COVID-19 Vaccine Information can be found at: [email or web address] For questions related to vaccine distribution or appointments, please email [email or web address] or call [phone].    

## 2020-05-29 NOTE — BH Specialist Note (Signed)
Pt does not recall making an appointment at this time, has been out of town, and requests to call back and reschedule. Pt agrees to have numbers sent to her via MyChart, and she will call back at a later date.   Integrated Behavioral Health via Telemedicine Video (Caregility) Visit  05/29/2020 Kathy Nichols [id number]   Rae Lips

## 2020-06-05 ENCOUNTER — Ambulatory Visit: Payer: BC Managed Care – PPO | Admitting: Clinical

## 2020-06-18 ENCOUNTER — Ambulatory Visit: Payer: BC Managed Care – PPO | Admitting: Physical Therapy

## 2020-07-11 ENCOUNTER — Other Ambulatory Visit: Payer: Self-pay

## 2020-07-11 ENCOUNTER — Ambulatory Visit: Payer: BC Managed Care – PPO | Attending: Obstetrics and Gynecology | Admitting: Physical Therapy

## 2020-07-11 DIAGNOSIS — M545 Low back pain, unspecified: Secondary | ICD-10-CM

## 2020-07-11 DIAGNOSIS — M6281 Muscle weakness (generalized): Secondary | ICD-10-CM | POA: Diagnosis present

## 2020-07-11 DIAGNOSIS — M62830 Muscle spasm of back: Secondary | ICD-10-CM | POA: Insufficient documentation

## 2020-07-11 DIAGNOSIS — M546 Pain in thoracic spine: Secondary | ICD-10-CM

## 2020-07-11 DIAGNOSIS — G8929 Other chronic pain: Secondary | ICD-10-CM

## 2020-07-11 NOTE — Therapy (Addendum)
[facility] [place] [address]  [place], [place], [address] Phone: [phone]   Fax:  [phone]  Physical Therapy Evaluation  Patient Details  Name: Kathy Nichols MRN: [id number] Date of Birth: 11-Jun-1986 Referring Provider (PT): Sloan Leiter, MD   Encounter Date: 07/11/2020   PT End of Session - 07/11/20 1018    Visit Number 1    Number of Visits 8    Date for PT Re-Evaluation 09/05/20    Authorization Type BCBS    PT Start Time 7035    PT Stop Time 1103    PT Time Calculation (min) 45 min    Activity Tolerance Patient tolerated treatment well;Other (comment)   session limited by pt having to sooth her infant daughter   Behavior During Therapy WFL for tasks assessed/performed           Past Medical History:  Diagnosis Date  . Iron deficiency anemia during pregnancy   . Migraine   . Rectus diastasis     Past Surgical History:  Procedure Laterality Date  . TONSILLECTOMY    . WISDOM TOOTH EXTRACTION      There were no vitals filed for this visit.    Subjective Assessment - 07/11/20 1022    Subjective Pt s/p 3 pregnancies, all with vaginal deliveries. Pt reports low back pain began during the 3rd trimester of her 2nd pregnancy (child now 73 y/o) after which she was diagnosed with 2 finger width diastasis recti - did not having any treatment for this at the time. Now s/p 3rd pregnancy (delivery 04/07/20) during which her back pain began in the 1st trimester and her diastasis recti progressed to 4 finger width. She did have some PT during the pregnancy for LBP and was told the baby had caused her pelvis to shift but uncertain of direction. She was supposed to start PT again last month but had to delay secondary to contracting COVID-19 infection and still notes decreased activity tolerance due to COVID. LBP has improved slightly since delivery and first referred to PT but has more recently started noticing upper back  and shoulder pain over last week or so. She states she feels like she has no core strength and notes she has also developed urge incontinence and has trouble getting to the bathroom quick enough at times. She also notes she is dealing with some post-partum depression.    Limitations Sitting;Reading    How long can you sit comfortably? ~10 minutes    How long can you stand comfortably? 1 hr    How long can you walk comfortably? 1 hr    Patient Stated Goals "to increased flexibilty and mobility and build core strength"    Currently in Pain? Yes    Pain Score 4    3-4/10 on average   Pain Location Back    Pain Orientation Lower    Pain Descriptors / Indicators Aching   "stiff"   Pain Type Chronic pain    Pain Radiating Towards into B hips (R>L)    Pain Onset Other (comment)   2nd pregancy - 4 yrs ago   Pain Frequency Intermittent    Aggravating Factors  sitting on floor, bending over, fatigue by end of day    Pain Relieving Factors nothing, trys to be aware of posture    Effect of Pain on Daily Activities moves slower, fatigue by end of day    Pain Score 6   5-6/10  Pain Location Thoracic   & B shoulders (R>L)   Pain Orientation Right;Left    Pain Descriptors / Indicators Tightness;Dull;Aching;Sore   "stiff"   Pain Type Acute pain    Pain Radiating Towards B scapula/shoulders    Pain Onset 1 to 4 weeks ago    Pain Frequency Constant    Aggravating Factors  sitting on floor, bending over, fatigue by end of day              [facility] PT Assessment - 07/11/20 1018      Assessment   Medical Diagnosis Chronic B low back pain    Referring Provider (PT) Sloan Leiter, MD    Onset Date/Surgical Date --   during 3rd trimester of 2nd pregnancy ~4 yrs ago   Next MD Visit none scheduled    Prior Therapy PT during most recent pregnancy      Precautions   Precautions None      Balance Screen   Has the patient fallen in the past 6 months No    Has the patient had a decrease in activity  level because of a fear of falling?  No    Is the patient reluctant to leave their home because of a fear of falling?  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   Leisure walking with kids in neighborhood - daily      Cognition   Overall Cognitive Status Within Functional Limits for tasks assessed      Observation/Other Assessments   Focus on Therapeutic Outcomes (FOTO)  Lumbar - 58% (42% limitation); Predicted 66% (34% limitation)      ROM / Strength   AROM / PROM / Strength AROM;Strength      AROM   AROM Assessment Site Lumbar    Lumbar Flexion hands to ankles but must move slowly into position    Lumbar Extension 50% limited    Lumbar - Right Side [facility] but tight in opp lateral flank/hip    Lumbar - Left Side Bend WFL but tight in opp lateral flank/hip    Lumbar - Right Rotation WFL    Lumbar - Left Rotation WFL      Strength   Overall Strength Comments tested in sitting as pt having to hold her baby to sooth baby    Strength Assessment Site Hip;Knee    Right/Left Hip Right;Left    Right Hip Flexion 4-/5    Right Hip Extension 3+/5    Right Hip External Rotation  4-/5    Right Hip Internal Rotation 4-/5    Right Hip ABduction 4/5    Right Hip ADduction 4/5    Left Hip Flexion 4/5    Left Hip Extension 3+/5    Left Hip External Rotation 4-/5    Left Hip Internal Rotation 4-/5    Left Hip ABduction 4/5    Left Hip ADduction 4/5    Right/Left Knee Right;Left    Right Knee Flexion 5/5    Right Knee Extension 5/5    Left Knee Flexion 5/5    Left Knee Extension 5/5      Flexibility   Soft Tissue Assessment /Muscle Length yes    Hamstrings  mild/mod tight B    ITB mild/mod tight B    Piriformis mild/mod tight B    Quadratus Lumborum mild/mod tight B        Palpation   SI assessment  apparent slight R anterior rotation of pelvis on sacrum                      Objective measurements completed on examination: See above findings.       [facility] Adult PT Treatment/Exercise - 07/11/20 1018      Exercises   Exercises Lumbar      Lumbar Exercises: Supine   Ab Set 5 reps;5 seconds    AB Set Limitations pt having difficulty recruiting abd muscles due to lack of sensation in lower abdominals    Other Supine Lumbar Exercises Kegel exercise instruction      Manual Therapy   Manual Therapy Muscle Energy Technique    Muscle Energy Technique MET to correct SIJ alignment followed by pelvic shotgun with audible cavitation                  PT Education - 07/11/20 1103    Education Details PT eval findings, anticipated POC & initial instructions in Kegel exercises    Person(s) Educated Patient    Methods Explanation;Demonstration;Verbal cues;Tactile cues;Handout    Comprehension Verbalized understanding;Returned demonstration;Verbal cues required;Tactile cues required;Need further instruction            PT Short Term Goals - 07/11/20 1103      PT SHORT TERM GOAL #1   Title Independent with initial HEP    Time 4    Period Weeks    Status New    Target Date 08/08/20      PT SHORT TERM GOAL #2   Title Patient will verbalize/demonstrate understanding of neutral spine posture and proper body mechanics to reduce strain on lumbar spine    Time 4    Period Weeks    Status New    Target Date 08/08/20      PT SHORT TERM GOAL #3   Title Patient to report pain reduction in frequency and intensity by >/= 25%    Time 4    Period Weeks    Status New    Target Date 08/08/20             PT Long Term Goals - 07/11/20 1103      PT LONG TERM GOAL #1   Title Independent with advanced HEP to support her abdomen and back    Time 8    Period Weeks    Status New    Target Date 09/05/20      PT LONG TERM GOAL #2    Title Patient to report pain reduction in frequency and intensity by >/= 75%    Baseline --    Time 8    Period Weeks    Status New    Target Date 09/05/20      PT LONG TERM GOAL #3   Title Patient to demonstrate ability to achieve and maintain good spinal alignment/posturing to reduce strain on lumbar spine and abdominal muscles    Time 8    Period Weeks    Status New    Target Date 09/05/20      PT LONG TERM GOAL #4   Title Patient to improve thoracolumbar AROM to WNL without pain provocation while maintaining neutral SIJ alignment    Time 8    Period  Weeks    Status New    Target Date 09/05/20      PT LONG TERM GOAL #5   Title Patient to report ability to perform ADLs, household and childcare-related tasks without increased pain    Time 8    Period Weeks    Status New    Target Date 09/05/20                  Plan - 07/11/20 1103    Clinical Impression Statement Kathy Nichols is a 34 y/o female who presents to OP PT for chronic LBP w/o sciatica originating during her 2nd pregnancy 4 yrs ago and exacerbated during recent pregnancy this year (delivery 04/07/20). She notes she was diagnosed with a 2 finger width diastasis recti following her 2nd pregnancy which has progressed to 4 finger width with latest pregnancy along with development of urge incontinence. Back pain more recently also encompassing upper back and B scapula/shoulders (R>L) for the past week or so. She notes ongoing lack of sensation in lower abdominals and pelvic floor following latest pregnancy. Assessment revealing mildly limited lumbar ROM, decreased proximal LE flexibility, apparent slight R anterior innominate rotation of pelvis on sacrum (correctable with MET) and decreased core and proximal LE strength. Further assessment of upper back and shoulder pain necessary, however limited today as pt had her infant daughter present with her during eval and was having to hold her daughter during most of the visit to sooth  her.  Kathy Nichols will benefit from skilled PT to address above deficits to restore normal SIJ alignment and muscle tension and reduce or eliminate LBP.    Personal Factors and Comorbidities Comorbidity 3+;Time since onset of injury/illness/exacerbation;Fitness    Comorbidities rectus diastasis, post-partum depression, migraines, recent COVID-19 infection    Examination-Activity Limitations Bed Mobility;Bend;Caring for Others;Continence;Carry;Lift;Sit;Squat;Stand;Locomotion Level    Examination-Participation Restrictions Cleaning;Community Activity;Interpersonal Relationship;Laundry;Meal Prep;Shop    Stability/Clinical Decision Making Evolving/Moderate complexity    Clinical Decision Making Moderate    Rehab Potential Good    PT Frequency 1x / week   1x/wk due to limited childcare options   PT Duration 8 weeks    PT Treatment/Interventions ADLs/Self Care Home Management;Electrical Stimulation;Moist Heat;Ultrasound;Functional mobility training;Therapeutic activities;Therapeutic exercise;Neuromuscular re-education;Patient/family education;Manual techniques;Passive range of motion;Dry needling;Taping;Spinal Manipulations    PT Next Visit Plan Complete upper back pain assessment; review Kegel exercises + update HEP to address flexibility and core strengthening; manual therapy and modalities PRN    PT Home Exercise Plan 8/4 - Kegel exercises    Consulted and Agree with Plan of Care Patient           Patient will benefit from skilled therapeutic intervention in order to improve the following deficits and impairments:  Abnormal gait, Decreased activity tolerance, Decreased endurance, Decreased knowledge of precautions, Decreased mobility, Decreased range of motion, Decreased strength, Difficulty walking, Increased fascial restricitons, Increased muscle spasms, Impaired perceived functional ability, Impaired flexibility, Impaired tone, Improper body mechanics, Postural dysfunction, Pain  Visit  Diagnosis: Chronic bilateral low back pain without sciatica - Plan: PT plan of care cert/re-cert  Pain in thoracic spine - Plan: PT plan of care cert/re-cert  Muscle weakness (generalized) - Plan: PT plan of care cert/re-cert  Muscle spasm of back - Plan: PT plan of care cert/re-cert     Problem List Patient Active Problem List   Diagnosis Date Noted  . Normal labor and delivery 04/07/2020  . Normal labor 04/07/2020  . 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 Family history of genetic disease 10/20/2019  .  Supervision of other normal pregnancy, antepartum 09/20/2019    Percival Spanish, PT, MPT 07/11/2020, 1:44 PM  [facility] [address]  [place], [place], [address] Phone: [phone]   Fax:  [phone]  Name: Kathy Nichols MRN: [id number] Date of Birth: Jan 08, 1986

## 2020-07-11 NOTE — Patient Instructions (Signed)
    Home exercise program created by Felica Chargois, PT.  For questions, please contact Jermayne Sweeney via phone at [phone] or email at [email or web address]  [facility] [place] [address]  [address] [place], [place], [address] Phone: [phone]   Fax:  [phone]    

## 2020-07-17 ENCOUNTER — Encounter: Payer: Self-pay | Admitting: Physical Therapy

## 2020-07-17 ENCOUNTER — Other Ambulatory Visit: Payer: Self-pay

## 2020-07-17 ENCOUNTER — Ambulatory Visit: Payer: BC Managed Care – PPO | Admitting: Physical Therapy

## 2020-07-17 DIAGNOSIS — M62830 Muscle spasm of back: Secondary | ICD-10-CM

## 2020-07-17 DIAGNOSIS — G8929 Other chronic pain: Secondary | ICD-10-CM

## 2020-07-17 DIAGNOSIS — M545 Low back pain, unspecified: Secondary | ICD-10-CM

## 2020-07-17 DIAGNOSIS — M546 Pain in thoracic spine: Secondary | ICD-10-CM

## 2020-07-17 DIAGNOSIS — M6281 Muscle weakness (generalized): Secondary | ICD-10-CM

## 2020-07-17 NOTE — Therapy (Signed)
[facility] [place] [address]  [place], [place], [address] Phone: [phone]   Fax:  [phone]  Physical Therapy Treatment  Patient Details  Name: Kathy Nichols MRN: [id number] Date of Birth: 06-May-1986 Referring Provider (PT): Sloan Leiter, MD   Encounter Date: 07/17/2020   PT End of Session - 07/17/20 1618    Visit Number 2    Number of Visits 8    Date for PT Re-Evaluation 09/05/20    Authorization Type BCBS    PT Start Time 1618    PT Stop Time 1707    PT Time Calculation (min) 49 min    Activity Tolerance Patient tolerated treatment well    Behavior During Therapy [facility] for tasks assessed/performed           Past Medical History:  Diagnosis Date  . Iron deficiency anemia during pregnancy   . Migraine   . Rectus diastasis     Past Surgical History:  Procedure Laterality Date  . TONSILLECTOMY    . WISDOM TOOTH EXTRACTION      There were no vitals filed for this visit.   Subjective Assessment - 07/17/20 1621    Subjective Pt reports pain has been better. Has been able to walk each morning, working on resuming her prior activity level.    Patient Stated Goals "to increased flexibilty and mobility and build core strength"    Currently in Pain? No/denies                             [facility] Adult PT Treatment/Exercise - 07/17/20 1618      Exercises   Exercises Lumbar      Lumbar Exercises: Stretches   Passive Hamstring Stretch Left;Right;30 seconds;2 reps    Passive Hamstring Stretch Limitations supine with strap    Standing Side Bend Right;Left;30 seconds;2 reps    Standing Side Bend Limitations QL stretch at wal    ITB Stretch Right;Left;30 seconds;2 reps    ITB Stretch Limitations supine crossbody with strap with knee slightly bent    Piriformis Stretch Right;Left;30 seconds;2 reps    Piriformis Stretch Limitations supine KTOS with opp LE bent and straight - pt preferring the  latter    Figure 4 Stretch 30 seconds;2 reps;Supine;With overpressure    Figure 4 Stretch Limitations figure 4 with down/out pressure on knee & figure 4 to chest - pt preferring KTOS as above      Lumbar Exercises: Supine   Ab Set 10 reps;5 seconds    AB Set Limitations verbal and tactile cues for muscle activation    Pelvic Tilt 10 reps;5 seconds    Pelvic Tilt Limitations TrA + posterior pelvic tilt    Clam 10 reps;3 seconds    Clam Limitations TrA + red TB alt hip ABD/ER    Bridge 5 reps;5 seconds    Bridge Limitations + hip ABD isometric     Bridge with Ball Squeeze 10 reps;5 seconds    Bridge with [employer] Limitations pt reporting better senstation of abdominal activation    Other Supine Lumbar Exercises review of Kegel exercise instruction                  PT Education - 07/17/20 1707    Education Details HEP - lumbopelvic flexibilty and stabilization/strengthening    Person(s) Educated Patient    Methods Explanation;Demonstration;Verbal cues;Tactile cues;Handout    Comprehension Verbalized understanding;Returned demonstration;Verbal  cues required;Tactile cues required;Need further instruction            PT Short Term Goals - 07/17/20 1717      PT SHORT TERM GOAL #1   Title Independent with initial HEP    Time 4    Period Weeks    Status On-going    Target Date 08/08/20      PT SHORT TERM GOAL #2   Title Patient will verbalize/demonstrate understanding of neutral spine posture and proper body mechanics to reduce strain on lumbar spine    Time 4    Period Weeks    Status On-going    Target Date 08/08/20      PT SHORT TERM GOAL #3   Title Patient to report pain reduction in frequency and intensity by >/= 25%    Time 4    Period Weeks    Status Partially Met    Target Date 08/08/20             PT Long Term Goals - 07/17/20 1718      PT LONG TERM GOAL #1   Title Independent with advanced HEP to support her abdomen and back    Time 8     Period Weeks    Status On-going    Target Date 09/05/20      PT LONG TERM GOAL #2   Title Patient to report pain reduction in frequency and intensity by >/= 75%    Time 8    Period Weeks    Status On-going    Target Date 09/05/20      PT LONG TERM GOAL #3   Title Patient to demonstrate ability to achieve and maintain good spinal alignment/posturing to reduce strain on lumbar spine and abdominal muscles    Time 8    Period Weeks    Status On-going    Target Date 09/05/20      PT LONG TERM GOAL #4   Title Patient to improve thoracolumbar AROM to WNL without pain provocation while maintaining neutral SIJ alignment    Time 8    Period Weeks    Status On-going    Target Date 09/05/20      PT LONG TERM GOAL #5   Title Patient to report ability to perform ADLs, household and childcare-related tasks without increased pain    Time 8    Period Weeks    Status On-going    Target Date 09/05/20                 Plan - 07/17/20 1707    Clinical Impression Statement Kathy Nichols reports her pain has been better since the eval but her hips still feel weak and unstable ("like the could pop out like a Barbie doll"). SIJ reassessment reveals neutral alignment maintained since last visit. She has been able to work on gradually increasing her daily morning walks but has not really attempted the Kegel exercises. Reviewed Kegel exercises and introduced basic lumbopelvic flexibility and stabilization/strengthening with HEP updated according to pt response to attempted stretches and exercises. Pt reports numbness limiting her ability to sense abdominal muscle activation, but she was eventually able to elicit pelvic tilt with lower abdominal activation. Pt reports she plans to complete her exercises following her morning walks with her kids, having the kids join her to help keep her on track.    Comorbidities rectus diastasis, post-partum depression, migraines, recent COVID-19 infection    Rehab Potential  Good    PT  Frequency 1x / week   1x/wk due to limited childcare options   PT Duration 8 weeks    PT Treatment/Interventions ADLs/Self Care Home Management;Electrical Stimulation;Moist Heat;Ultrasound;Functional mobility training;Therapeutic activities;Therapeutic exercise;Neuromuscular re-education;Patient/family education;Manual techniques;Passive range of motion;Dry needling;Taping;Spinal Manipulations    PT Next Visit Plan Complete upper back pain assessment; review Kegel exercises + initial HEP; posture and body mechanics education; lumbopelvic flexibility and core strengthening; manual therapy and modalities PRN    PT Home Exercise Plan 8/4 - Kegel exercises; 8/10 - QL, HS, ITB & piriformis stretches, pelvic tilt, hook lying clam, TrA bridge + ball squeeze           Patient will benefit from skilled therapeutic intervention in order to improve the following deficits and impairments:  Abnormal gait, Decreased activity tolerance, Decreased endurance, Decreased knowledge of precautions, Decreased mobility, Decreased range of motion, Decreased strength, Difficulty walking, Increased fascial restricitons, Increased muscle spasms, Impaired perceived functional ability, Impaired flexibility, Impaired tone, Improper body mechanics, Postural dysfunction, Pain  Visit Diagnosis: Chronic bilateral low back pain without sciatica  Pain in thoracic spine  Muscle weakness (generalized)  Muscle spasm of back     Problem List Patient Active Problem List   Diagnosis Date Noted  . Normal labor and delivery 04/07/2020  . Normal labor 04/07/2020  . 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 Family history of genetic disease 10/20/2019  . Supervision of other normal pregnancy, antepartum 09/20/2019    Percival Spanish, PT, MPT 07/17/2020, 5:53 PM  [facility] [address]  [place], [place], [address] Phone: [phone]   Fax:   [phone]  Name: Kathy Nichols MRN: [id number] Date of Birth: [birth year]

## 2020-07-17 NOTE — Patient Instructions (Signed)
    Home exercise program created by Tryston Gilliam, PT.  For questions, please contact Denna Fryberger via phone at [phone] or email at [email or web address]  [facility] [place] [address]  [address] [place], [place], [address] Phone: [phone]   Fax:  [phone]    

## 2020-07-24 ENCOUNTER — Encounter: Payer: Self-pay | Admitting: Physical Therapy

## 2020-07-24 ENCOUNTER — Other Ambulatory Visit: Payer: Self-pay

## 2020-07-24 ENCOUNTER — Ambulatory Visit: Payer: BC Managed Care – PPO | Admitting: Physical Therapy

## 2020-07-24 DIAGNOSIS — M545 Low back pain: Secondary | ICD-10-CM | POA: Diagnosis not present

## 2020-07-24 DIAGNOSIS — G8929 Other chronic pain: Secondary | ICD-10-CM

## 2020-07-24 DIAGNOSIS — M546 Pain in thoracic spine: Secondary | ICD-10-CM

## 2020-07-24 DIAGNOSIS — M62830 Muscle spasm of back: Secondary | ICD-10-CM

## 2020-07-24 DIAGNOSIS — M6281 Muscle weakness (generalized): Secondary | ICD-10-CM

## 2020-07-24 NOTE — Therapy (Signed)
[facility] [place] [address]  [place], [place], [address] Phone: [phone]   Fax:  [phone]  Physical Therapy Treatment  Patient Details  Name: Kathy Nichols MRN: [id number] Date of Birth: [birth year] Referring Provider (PT): Sloan Leiter, MD   Encounter Date: 07/24/2020   PT End of Session - 07/24/20 1622    Visit Number 3    Number of Visits 8    Date for PT Re-Evaluation 09/05/20    Authorization Type BCBS    PT Start Time 1622   Pt arrived late   PT Stop Time 1700    PT Time Calculation (min) 38 min    Activity Tolerance Patient tolerated treatment well    Behavior During Therapy [facility] for tasks assessed/performed           Past Medical History:  Diagnosis Date  . Iron deficiency anemia during pregnancy   . Migraine   . Rectus diastasis     Past Surgical History:  Procedure Laterality Date  . TONSILLECTOMY    . WISDOM TOOTH EXTRACTION      There were no vitals filed for this visit.   Subjective Assessment - 07/24/20 1625    Subjective Pt reports pain continues to improve. Really likes some of the stretches and does these often. Denies pain today, noting only fatigue and tightness.    Patient Stated Goals "to increased flexibilty and mobility and build core strength"    Currently in Pain? No/denies                             [facility] Adult PT Treatment/Exercise - 07/24/20 1622      Lumbar Exercises: Stretches   Piriformis Stretch Right;Left;30 seconds;2 reps    Piriformis Stretch Limitations supine KTOS with opp LE straight       Lumbar Exercises: Aerobic   Recumbent Bike L1 x 6 min      Lumbar Exercises: Supine   Clam 10 reps;3 seconds    Clam Limitations TrA + red TB alt hip ABD/ER    Bent Knee Raise 10 reps;3 seconds    Bent Knee Raise Limitations red TB brace marching    Bridge with Ball Squeeze 10 reps;5 seconds      Lumbar Exercises: Sidelying   Clam  Right;Left;10 reps;3 seconds    Clam Limitations red TB      Lumbar Exercises: Quadruped   Madcat/Old Horse 10 reps                    PT Short Term Goals - 07/17/20 1717      PT SHORT TERM GOAL #1   Title Independent with initial HEP    Time 4    Period Weeks    Status On-going    Target Date 08/08/20      PT SHORT TERM GOAL #2   Title Patient will verbalize/demonstrate understanding of neutral spine posture and proper body mechanics to reduce strain on lumbar spine    Time 4    Period Weeks    Status On-going    Target Date 08/08/20      PT SHORT TERM GOAL #3   Title Patient to report pain reduction in frequency and intensity by >/= 25%    Time 4    Period Weeks    Status Partially Met    Target Date 08/08/20  PT Long Term Goals - 07/17/20 1718      PT LONG TERM GOAL #1   Title Independent with advanced HEP to support her abdomen and back    Time 8    Period Weeks    Status On-going    Target Date 09/05/20      PT LONG TERM GOAL #2   Title Patient to report pain reduction in frequency and intensity by >/= 75%    Time 8    Period Weeks    Status On-going    Target Date 09/05/20      PT LONG TERM GOAL #3   Title Patient to demonstrate ability to achieve and maintain good spinal alignment/posturing to reduce strain on lumbar spine and abdominal muscles    Time 8    Period Weeks    Status On-going    Target Date 09/05/20      PT LONG TERM GOAL #4   Title Patient to improve thoracolumbar AROM to WNL without pain provocation while maintaining neutral SIJ alignment    Time 8    Period Weeks    Status On-going    Target Date 09/05/20      PT LONG TERM GOAL #5   Title Patient to report ability to perform ADLs, household and childcare-related tasks without increased pain    Time 8    Period Weeks    Status On-going    Target Date 09/05/20                 Plan - 07/24/20 1700    Clinical Impression Statement Kathy Nichols reports  benefit from the stretches and is starting to feel more confident with recruiting her abdominal muscles but states it still takes a few tries to create the desired movement combination with the HEP. HEP reviewed with minor clarifications necessary but otherwise pt able to perform good return demonstration and pt denying need for review of ITB or QL stretches (pt notes these are her favorite). Worked on progression of lumbopelvic strengthening/stabilization with pt noting continued challenge recruiting abdominal muscles at times as well as fatigue but no pain.    Comorbidities rectus diastasis, post-partum depression, migraines, recent COVID-19 infection    Rehab Potential Good    PT Frequency 1x / week   1x/wk due to limited childcare options   PT Duration 8 weeks    PT Treatment/Interventions ADLs/Self Care Home Management;Electrical Stimulation;Moist Heat;Ultrasound;Functional mobility training;Therapeutic activities;Therapeutic exercise;Neuromuscular re-education;Patient/family education;Manual techniques;Passive range of motion;Dry needling;Taping;Spinal Manipulations    PT Next Visit Plan Complete upper back pain assessment PRN; posture and body mechanics education; lumbopelvic flexibility and core strengthening; manual therapy and modalities PRN    PT Home Exercise Plan 8/4 - Kegel exercises; 8/10 - QL, HS, ITB & piriformis stretches, pelvic tilt, hook lying clam, TrA bridge + ball squeeze           Patient will benefit from skilled therapeutic intervention in order to improve the following deficits and impairments:  Abnormal gait, Decreased activity tolerance, Decreased endurance, Decreased knowledge of precautions, Decreased mobility, Decreased range of motion, Decreased strength, Difficulty walking, Increased fascial restricitons, Increased muscle spasms, Impaired perceived functional ability, Impaired flexibility, Impaired tone, Improper body mechanics, Postural dysfunction, Pain  Visit  Diagnosis: Chronic bilateral low back pain without sciatica  Pain in thoracic spine  Muscle weakness (generalized)  Muscle spasm of back     Problem List Patient Active Problem List   Diagnosis Date Noted  . Normal labor and delivery 04/07/2020  .  Normal labor 04/07/2020  . 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 Family history of genetic disease 10/20/2019  . Supervision of other normal pregnancy, antepartum 09/20/2019    Percival Spanish, PT, MPT 07/24/2020, 6:46 PM  [facility] [address]  [place], [place], [address] Phone: [phone]   Fax:  [phone]  Name: Kathy Nichols MRN: [id number] Date of Birth: [birth year]

## 2020-07-31 ENCOUNTER — Encounter: Payer: Self-pay | Admitting: Physical Therapy

## 2020-07-31 ENCOUNTER — Ambulatory Visit: Payer: BC Managed Care – PPO | Admitting: Physical Therapy

## 2020-07-31 ENCOUNTER — Other Ambulatory Visit: Payer: Self-pay

## 2020-07-31 DIAGNOSIS — M545 Low back pain, unspecified: Secondary | ICD-10-CM

## 2020-07-31 DIAGNOSIS — M546 Pain in thoracic spine: Secondary | ICD-10-CM

## 2020-07-31 DIAGNOSIS — M62830 Muscle spasm of back: Secondary | ICD-10-CM

## 2020-07-31 DIAGNOSIS — G8929 Other chronic pain: Secondary | ICD-10-CM

## 2020-07-31 DIAGNOSIS — M6281 Muscle weakness (generalized): Secondary | ICD-10-CM

## 2020-07-31 NOTE — Therapy (Addendum)
[facility] [place] [address]  [place], [place], [address] Phone: [phone]   Fax:  [phone]  Physical Therapy Treatment  Patient Details  Name: Kathy Nichols MRN: [id number] Date of Birth: [birth year] Referring Provider (PT): Sloan Leiter, MD   Encounter Date: 07/31/2020   PT End of Session - 07/31/20 4235    Visit Number 4    Number of Visits 8    Date for PT Re-Evaluation 09/05/20    Authorization Type BCBS    PT Start Time 3614    PT Stop Time 1710    PT Time Calculation (min) 53 min    Activity Tolerance Patient tolerated treatment well    Behavior During Therapy [facility] for tasks assessed/performed           Past Medical History:  Diagnosis Date  . Iron deficiency anemia during pregnancy   . Migraine   . Rectus diastasis     Past Surgical History:  Procedure Laterality Date  . TONSILLECTOMY    . WISDOM TOOTH EXTRACTION      There were no vitals filed for this visit.   Subjective Assessment - 07/31/20 1620    Subjective Pt reports she has continued with her walking but has not been able to work on HEP for the past week with getting her kids started back at school. She reports her low back pain has been doing much better but still notes more discomfort in upper back and shoulders.    Patient Stated Goals "to increased flexibilty and mobility and build core strength"    Currently in Pain? Yes    Pain Score 0-No pain    Pain Location Back    Pain Orientation Lower    Pain Frequency Occasional    Pain Score 0    Pain Location Thoracic    Pain Descriptors / Indicators Tender    Pain Type Acute pain    Pain Radiating Towards B scapula/shoulders    Pain Frequency Intermittent              [facility] PT Assessment - 07/31/20 1617      Assessment   Medical Diagnosis Chronic B low back pain    Referring Provider (PT) Sloan Leiter, MD    Next MD Visit none scheduled      AROM   Overall AROM  Comments B shoulder ROM WFL but notes decreased FIR on R    AROM Assessment Site Cervical    Cervical Flexion 55 - tight    Cervical Extension 58 - stiff and sore    Cervical - Right Side Bend 35    Cervical - Left Side Bend 32 - "stiff"    Cervical - Right Rotation 60    Cervical - Left Rotation 61 - "crunchy"      Strength   Strength Assessment Site Shoulder    Right/Left Shoulder Right;Left    Right Shoulder Flexion 4+/5    Right Shoulder ABduction 4/5    Right Shoulder Internal Rotation 4+/5    Right Shoulder External Rotation 4/5    Left Shoulder Flexion 4+/5    Left Shoulder ABduction 4+/5    Left Shoulder Internal Rotation 4+/5    Left Shoulder External Rotation 4/5                         [facility] Adult PT Treatment/Exercise - 07/31/20 1617      Self-Care  Self-Care Posture    Posture Provided education in posture & body mechanics for typical daily tasks and activities, highlighting childcare related tasks      Neck Exercises: Seated   Neck Retraction 10 reps;5 secs    Other Seated Exercise Scapular retraction and depression 10 x 5"      Lumbar Exercises: Aerobic   Nustep L4 x 6 min (UE/LE)      Lumbar Exercises: Standing   Scapular Retraction Both;10 reps;Strengthening;Theraband    Theraband Level (Scapular Retraction) Level 2 (Red)    Scapular Retraction Limitations low rows/retraction with cues for abdominal bracing (staggered stance)     Row Both;10 reps;Strengthening;Theraband    Theraband Level (Row) Level 2 (Red)    Row Limitations cues for abdominal bracing (staggered stance) and scap retraction      Neck Exercises: Stretches   Upper Trapezius Stretch Right;Left;30 seconds;1 rep    Levator Stretch Right;Left;30 seconds;1 rep    Corner Stretch 30 seconds;3 reps    Corner Stretch Limitations 3-way doorway pec stretch                  PT Education - 07/31/20 1710    Education Details HEP update - upper back stretches & postural  exercises; Posture & body mechanics for typical daily tasks and activities    Person(s) Educated Patient    Methods Explanation;Demonstration;Verbal cues;Tactile cues;Handout    Comprehension Verbalized understanding;Verbal cues required;Tactile cues required;Returned demonstration;Need further instruction            PT Short Term Goals - 07/31/20 1710      PT SHORT TERM GOAL #1   Title Independent with initial HEP    Status Achieved   07/31/20     PT SHORT TERM GOAL #2   Title Patient will verbalize/demonstrate understanding of neutral spine posture and proper body mechanics to reduce strain on lumbar spine    Time 4    Period Weeks    Status On-going    Target Date 08/08/20      PT SHORT TERM GOAL #3   Title Patient to report pain reduction in frequency and intensity by >/= 25%    Time 4    Period Weeks    Status Partially Met    Target Date 08/08/20             PT Long Term Goals - 07/17/20 1718      PT LONG TERM GOAL #1   Title Independent with advanced HEP to support her abdomen and back    Time 8    Period Weeks    Status On-going    Target Date 09/05/20      PT LONG TERM GOAL #2   Title Patient to report pain reduction in frequency and intensity by >/= 75%    Time 8    Period Weeks    Status On-going    Target Date 09/05/20      PT LONG TERM GOAL #3   Title Patient to demonstrate ability to achieve and maintain good spinal alignment/posturing to reduce strain on lumbar spine and abdominal muscles    Time 8    Period Weeks    Status On-going    Target Date 09/05/20      PT LONG TERM GOAL #4   Title Patient to improve thoracolumbar AROM to WNL without pain provocation while maintaining neutral SIJ alignment    Time 8    Period Weeks    Status On-going  Target Date 09/05/20      PT LONG TERM GOAL #5   Title Patient to report ability to perform ADLs, household and childcare-related tasks without increased pain    Time 8    Period Weeks    Status  On-going    Target Date 09/05/20                 Plan - 07/31/20 1624    Clinical Impression Statement Kathy Nichols reports she feels like her control with holding back her urine is getting better with getting to the bathroom and notes her low back pain has been much better even when getting down on the floor with her kids, but still notes more discomfort in upper back and shoulder area. Cervical and B shoulder ROM essentially WFL but increased muscle tension and tightness noted with some motions. B shoulder strength 4 to 4+/5 but pt noting generalized muscle fatigue especially in scapular and postural muscles. Introduced postural stretches and strengthening exercises for upper back/shoulders with pt noting some good initial relief (HEP updated). Also provided education in postural awareness and proper body mechanics for typical daily tasks including addressing childcare related tasks with pt noting areas for improvement.    Comorbidities rectus diastasis, post-partum depression, migraines, recent COVID-19 infection    Rehab Potential Good    PT Frequency 1x / week   1x/wk due to limited childcare options   PT Duration 8 weeks    PT Treatment/Interventions ADLs/Self Care Home Management;Electrical Stimulation;Moist Heat;Ultrasound;Functional mobility training;Therapeutic activities;Therapeutic exercise;Neuromuscular re-education;Patient/family education;Manual techniques;Passive range of motion;Dry needling;Taping;Spinal Manipulations    PT Next Visit Plan lumbopelvic and upper back/neck flexibility and core/postural strengthening; manual therapy and modalities PRN; review of posture and body mechanics education PRN    PT Home Exercise Plan 8/4 - Kegel exercises; 8/10 - QL, HS, ITB & piriformis stretches, pelvic tilt, hook lying clam, TrA bridge + ball squeeze; 8/24 - UT, LS & doorway pec stretches, chin tuck, red TB rows/retraction    Consulted and Agree with Plan of Care Patient            Patient will benefit from skilled therapeutic intervention in order to improve the following deficits and impairments:  Abnormal gait, Decreased activity tolerance, Decreased endurance, Decreased knowledge of precautions, Decreased mobility, Decreased range of motion, Decreased strength, Difficulty walking, Increased fascial restricitons, Increased muscle spasms, Impaired perceived functional ability, Impaired flexibility, Impaired tone, Improper body mechanics, Postural dysfunction, Pain  Visit Diagnosis: Chronic bilateral low back pain without sciatica  Pain in thoracic spine  Muscle weakness (generalized)  Muscle spasm of back  Acute low back pain without sciatica, unspecified back pain laterality     Problem List Patient Active Problem List   Diagnosis Date Noted  . Normal labor and delivery 04/07/2020  . Normal labor 04/07/2020  . 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 Family history of genetic disease 10/20/2019  . Supervision of other normal pregnancy, antepartum 09/20/2019    Percival Spanish, PT, MPT 07/31/2020, 6:26 PM  [facility] [address]  [place], [place], [address] Phone: [phone]   Fax:  [phone]  Name: Kathy Nichols MRN: [id number] Date of Birth: 09/03/1986

## 2020-07-31 NOTE — Patient Instructions (Addendum)
  Home exercise program created by Ronette Hank, PT.  For questions, please contact Makinzie Considine via phone at [phone] or email at [email or web address]  [facility] [place] [address]  [address] [place], [place], [address] Phone: [phone]   Fax:  [phone]     Sleeping on Back  Place pillow under knees. A pillow with cervical support and a roll around waist are also helpful. Copyright  VHI. All rights reserved.  Sleeping on Side Place pillow between knees. Use cervical support under neck and a roll around waist as needed. Copyright  VHI. All rights reserved.   Sleeping on Stomach   If this is the only desirable sleeping position, place pillow under lower legs, and under stomach or chest as needed.  Posture - Sitting   Sit upright, head facing forward. Try using a roll to support lower back. Keep shoulders relaxed, and avoid rounded back. Keep hips level with knees. Avoid crossing legs for long periods. Stand to Sit / Sit to Stand   To sit: Bend knees to lower self onto front edge of chair, then scoot back on seat. To stand: Reverse sequence by placing one foot forward, and scoot to front of seat. Use rocking motion to stand up.   Work Height and Reach  Ideal work height is no more than 2 to 4 inches below elbow level when standing, and at elbow level when sitting. Reaching should be limited to arm's length, with elbows slightly bent.  Bending  Bend at hips and knees, not back. Keep feet shoulder-width apart.    Posture - Standing   Good posture is important. Avoid slouching and forward head thrust. Maintain curve in low back and align ears over shoul- ders, hips over ankles.  Alternating Positions   Alternate tasks and change positions frequently to reduce fatigue and muscle tension. Take rest breaks. Computer Work   Position work to face forward. Use proper work and seat height. Keep shoulders back  and down, wrists straight, and elbows at right angles. Use chair that provides full back support. Add footrest and lumbar roll as needed.  Getting Into / Out of Car  Lower self onto seat, scoot back, then bring in one leg at a time. Reverse sequence to get out.  Dressing  Lie on back to pull socks or slacks over feet, or sit and bend leg while keeping back straight.    Housework - Sink  Place one foot on ledge of cabinet under sink when standing at sink for prolonged periods.   Pushing / Pulling  Pushing is preferable to pulling. Keep back in proper alignment, and use leg muscles to do the work.  Deep Squat   Squat and lift with both arms held against upper trunk. Tighten stomach muscles without holding breath. Use smooth movements to avoid jerking.  Avoid Twisting   Avoid twisting or bending back. Pivot around using foot movements, and bend at knees if needed when reaching for articles.  Carrying Luggage   Distribute weight evenly on both sides. Use a cart whenever possible. Do not twist trunk. Move body as a unit.   Lifting Principles .Maintain proper posture and head alignment. .Slide object as close as possible before lifting. .Move obstacles out of the way. .Test before lifting; ask for help if too heavy. .Tighten stomach muscles without holding breath. .Use smooth movements; do not jerk. .Use legs to do the work, and pivot with feet. .Distribute the work load symmetrically and   close to the center of trunk. .Push instead of pull whenever possible.   Ask For Help   Ask for help and delegate to others when possible. Coordinate your movements when lifting together, and maintain the low back curve.  Log Roll   Lying on back, bend left knee and place left arm across chest. Roll all in one movement to the right. Reverse to roll to the left. Always move as one unit. Housework - Sweeping  Use long-handled equipment to avoid stooping.   Housework -  Wiping  Position yourself as close as possible to reach work surface. Avoid straining your back.  Laundry - Unloading Wash   To unload small items at bottom of washer, lift leg opposite to arm being used to reach.  Gardening - Raking  Move close to area to be raked. Use arm movements to do the work. Keep back straight and avoid twisting.     Cart  When reaching into cart with one arm, lift opposite leg to keep back straight.   Getting Into / Out of Bed  Lower self to lie down on one side by raising legs and lowering head at the same time. Use arms to assist moving without twisting. Bend both knees to roll onto back if desired. To sit up, start from lying on side, and use same move-ments in reverse. Housework - Vacuuming  Hold the vacuum with arm held at side. Step back and forth to move it, keeping head up. Avoid twisting.   Laundry - Loading Wash  Position laundry basket so that bending and twisting can be avoided.   Laundry - Unloading Dryer  Squat down to reach into clothes dryer or use a reacher.  Gardening - Weeding / Planting  Squat or Kneel. Knee pads may be helpful.                    

## 2020-08-07 ENCOUNTER — Encounter: Payer: Self-pay | Admitting: Physical Therapy

## 2020-08-07 ENCOUNTER — Ambulatory Visit: Payer: BC Managed Care – PPO | Admitting: Physical Therapy

## 2020-08-07 ENCOUNTER — Other Ambulatory Visit: Payer: Self-pay

## 2020-08-07 DIAGNOSIS — M545 Low back pain: Secondary | ICD-10-CM | POA: Diagnosis not present

## 2020-08-07 DIAGNOSIS — G8929 Other chronic pain: Secondary | ICD-10-CM

## 2020-08-07 DIAGNOSIS — M62830 Muscle spasm of back: Secondary | ICD-10-CM

## 2020-08-07 DIAGNOSIS — M6281 Muscle weakness (generalized): Secondary | ICD-10-CM

## 2020-08-07 DIAGNOSIS — M546 Pain in thoracic spine: Secondary | ICD-10-CM

## 2020-08-07 NOTE — Therapy (Signed)
[facility] [place] [address]  [place], [place], [address] Phone: [phone]   Fax:  [phone]  Physical Therapy Treatment  Patient Details  Name: Kathy Nichols MRN: [id number] Date of Birth: [birth year] Referring Provider (PT): Sloan Leiter, MD   Encounter Date: 08/07/2020   PT End of Session - 08/07/20 1619    Visit Number 5    Number of Visits 8    Date for PT Re-Evaluation 09/05/20    Authorization Type BCBS    PT Start Time 1619    PT Stop Time 1703    PT Time Calculation (min) 44 min    Activity Tolerance Patient tolerated treatment well    Behavior During Therapy [facility] for tasks assessed/performed           Past Medical History:  Diagnosis Date  . Iron deficiency anemia during pregnancy   . Migraine   . Rectus diastasis     Past Surgical History:  Procedure Laterality Date  . TONSILLECTOMY    . WISDOM TOOTH EXTRACTION      There were no vitals filed for this visit.   Subjective Assessment - 08/07/20 1621    Subjective Pt reports pain is "much, much better".  She feels the information from posture and body mechanics training is really helping.    Patient Stated Goals "to increased flexibilty and mobility and build core strength"    Currently in Pain? No/denies                             [facility] Adult PT Treatment/Exercise - 08/07/20 1619      Neck Exercises: Theraband   Shoulder External Rotation 10 reps;Red    Shoulder External Rotation Limitations hook lying on pool noodle - cues for scap retraction into noodle    Horizontal ABduction 10 reps;Red    Horizontal ABduction Limitations hook lying on pool noodle - cues for scap retraction into noodle    Other Theraband Exercises Alt UE diagonals with red TB 10 x 3"; hook lying on pool noodle - cues for scap retraction into noodle      Neck Exercises: Prone   W Back 10 reps    W Back Limitations prone over green Pball      Shoulder Extension 10 reps    Shoulder Extension Limitations I's + slight thoracic extension; prone over green Pball     Other Prone Exercise T's + slight thoracic extension 10 x 3" each; prone over green Pball       Lumbar Exercises: Aerobic   Recumbent Bike L2 x 6 min      Lumbar Exercises: Standing   Scapular Retraction Both;10 reps;Strengthening;Theraband    Theraband Level (Scapular Retraction) Level 2 (Red)    Scapular Retraction Limitations low rows/retraction with cues for abdominal bracing (staggered stance)     Row Both;10 reps;Strengthening;Theraband    Theraband Level (Row) Level 2 (Red)    Row Limitations cues for abdominal bracing (staggered stance) and scap retraction      Neck Exercises: Stretches   Chest Stretch 60 seconds;2 reps    Chest Stretch Limitations hook lying "snow angel" stretch over pool noodle    Other Neck Stretches B rhomboid/MT stretch x 30 sec                    PT Short Term Goals - 08/07/20 1623      PT  SHORT TERM GOAL #1   Title Independent with initial HEP    Status Achieved   07/31/20     PT SHORT TERM GOAL #2   Title Patient will verbalize/demonstrate understanding of neutral spine posture and proper body mechanics to reduce strain on lumbar spine    Status Achieved   08/07/20     PT SHORT TERM GOAL #3   Title Patient to report pain reduction in frequency and intensity by >/= 25%    Status Achieved   08/07/20 - Pt reports pain >50% better            PT Long Term Goals - 08/07/20 1628      PT LONG TERM GOAL #1   Title Independent with advanced HEP to support her abdomen and back    Time 8    Period Weeks    Status Partially Met      PT LONG TERM GOAL #2   Title Patient to report pain reduction in frequency and intensity by >/= 75%    Time 8    Period Weeks    Status On-going      PT LONG TERM GOAL #3   Title Patient to demonstrate ability to achieve and maintain good spinal alignment/posturing to reduce strain on  lumbar spine and abdominal muscles    Time 8    Period Weeks    Status On-going      PT LONG TERM GOAL #4   Title Patient to improve thoracolumbar AROM to WNL without pain provocation while maintaining neutral SIJ alignment    Time 8    Period Weeks    Status On-going      PT LONG TERM GOAL #5   Title Patient to report ability to perform ADLs, household and childcare-related tasks without increased pain    Time 8    Period Weeks    Status On-going                 Plan - 08/07/20 1703    Clinical Impression Statement Kathy Nichols reports her pain is >50% improved with LBP resolved and upper back pain much better - no pain today.  She reports she has been trying to incorporate the posture and body mechanics and exercises into her daily routine as she feels this helps her be more consistent with performance and compliance. All STGs now met. Reviewed standing rows/retraction from latest HEP as she states she has not attempted these yet but denies need for review of remaining exercises. Introduced further stretching and strengthening for upper back including pec stretching and postural/scapular strengthening in hook lying on pool noodle as well as scapular retraction and thoracic extension over green Pball with pt noting good relief in problem areas with stretches and exercises. Pt declining formal HEP instructions for new exercises but states intent to use a pool noodle at home for stretching and scapular/postural exercises.    Comorbidities rectus diastasis, post-partum depression, migraines, recent COVID-19 infection    Rehab Potential Good    PT Frequency 1x / week   1x/wk due to limited childcare options   PT Duration 8 weeks    PT Treatment/Interventions ADLs/Self Care Home Management;Electrical Stimulation;Moist Heat;Ultrasound;Functional mobility training;Therapeutic activities;Therapeutic exercise;Neuromuscular re-education;Patient/family education;Manual techniques;Passive range of  motion;Dry needling;Taping;Spinal Manipulations    PT Next Visit Plan lumbopelvic and upper back/neck flexibility and core/postural strengthening; manual therapy and modalities PRN; review of posture and body mechanics education PRN    PT Home Exercise Plan 8/4 - Kegel  exercises; 8/10 - QL, HS, ITB & piriformis stretches, pelvic tilt, hook lying clam, TrA bridge + ball squeeze; 8/24 - UT, LS & doorway pec stretches, chin tuck, red TB rows/retraction    Consulted and Agree with Plan of Care Patient           Patient will benefit from skilled therapeutic intervention in order to improve the following deficits and impairments:  Abnormal gait, Decreased activity tolerance, Decreased endurance, Decreased knowledge of precautions, Decreased mobility, Decreased range of motion, Decreased strength, Difficulty walking, Increased fascial restricitons, Increased muscle spasms, Impaired perceived functional ability, Impaired flexibility, Impaired tone, Improper body mechanics, Postural dysfunction, Pain  Visit Diagnosis: Chronic bilateral low back pain without sciatica  Pain in thoracic spine  Muscle weakness (generalized)  Muscle spasm of back     Problem List Patient Active Problem List   Diagnosis Date Noted  . Normal labor and delivery 04/07/2020  . Normal labor 04/07/2020  . 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 Family history of genetic disease 10/20/2019  . Supervision of other normal pregnancy, antepartum 09/20/2019    Percival Spanish, PT, MPT 08/07/2020, 6:30 PM  [facility] [address]  [place], [place], [address] Phone: [phone]   Fax:  [phone]  Name: Kathy Nichols MRN: [id number] Date of Birth: 06/21/1986

## 2020-08-16 ENCOUNTER — Encounter: Payer: Self-pay | Admitting: Physical Therapy

## 2020-08-16 ENCOUNTER — Other Ambulatory Visit: Payer: Self-pay

## 2020-08-16 ENCOUNTER — Ambulatory Visit: Payer: BC Managed Care – PPO | Attending: Obstetrics and Gynecology | Admitting: Physical Therapy

## 2020-08-16 DIAGNOSIS — M546 Pain in thoracic spine: Secondary | ICD-10-CM | POA: Insufficient documentation

## 2020-08-16 DIAGNOSIS — M6281 Muscle weakness (generalized): Secondary | ICD-10-CM | POA: Diagnosis present

## 2020-08-16 DIAGNOSIS — G8929 Other chronic pain: Secondary | ICD-10-CM | POA: Insufficient documentation

## 2020-08-16 DIAGNOSIS — M25551 Pain in right hip: Secondary | ICD-10-CM | POA: Diagnosis present

## 2020-08-16 DIAGNOSIS — M62830 Muscle spasm of back: Secondary | ICD-10-CM | POA: Diagnosis present

## 2020-08-16 DIAGNOSIS — M545 Low back pain: Secondary | ICD-10-CM | POA: Insufficient documentation

## 2020-08-16 NOTE — Therapy (Signed)
[facility] [place] [address]  [place], [place], [address] Phone: [phone]   Fax:  [phone]  Physical Therapy Treatment  Patient Details  Name: Kathy Nichols MRN: [id number] Date of Birth: [birth year] Referring Provider (PT): Sloan Leiter, MD   Encounter Date: 08/16/2020   PT End of Session - 08/16/20 1623    Visit Number 6    Number of Visits 8    Date for PT Re-Evaluation 09/05/20    Authorization Type BCBS    PT Start Time 1623    PT Stop Time 1719    PT Time Calculation (min) 56 min    Activity Tolerance Patient tolerated treatment well    Behavior During Therapy [facility] for tasks assessed/performed           Past Medical History:  Diagnosis Date  . Iron deficiency anemia during pregnancy   . Migraine   . Rectus diastasis     Past Surgical History:  Procedure Laterality Date  . TONSILLECTOMY    . WISDOM TOOTH EXTRACTION      There were no vitals filed for this visit.   Subjective Assessment - 08/16/20 1629    Subjective Pt reports increase in upper back pain this week triggered by lifitng her daughter in the car seat from the stroller to the car ~2 days ago. Low back pain remains well controlled.    Patient Stated Goals "to increased flexibilty and mobility and build core strength"    Currently in Pain? Yes    Pain Score 2    6-7/10 when lifting car seat   Pain Location Thoracic    Pain Orientation Upper    Pain Descriptors / Indicators Burning;Sharp    Pain Type Acute pain    Pain Frequency Constant   varies in intensity   Pain Score 0    Pain Location Back    Pain Orientation Lower                             [facility] Adult PT Treatment/Exercise - 08/16/20 1623      Neck Exercises: Theraband   Shoulder Extension 10 reps;Red    Shoulder Extension Limitations seated - emphasis on scap retraction & neutral cervical spine    Rows 10 reps;Red    Rows Limitations seated -  emphasis on scap retraction & neutral cervical spine      Lumbar Exercises: Aerobic   Recumbent Bike L2 x 6 min      Modalities   Modalities Electrical Stimulation;Moist Heat      Moist Heat Therapy   Number Minutes Moist Heat 15 Minutes    Moist Heat Location Cervical      Electrical Stimulation   Electrical Stimulation Location B cervicothoracic paraspinals    Electrical Stimulation Action IFC    Electrical Stimulation Parameters 80-150 Hz, intensity to pt tolerance    Electrical Stimulation Goals Pain;Tone      Manual Therapy   Manual Therapy Joint mobilization;Soft tissue mobilization;Myofascial release;Manual Traction;Passive ROM    Joint Mobilization Lower cervical & upper thoracic grade II-III CPA and UPAs    Soft tissue mobilization STM to B cervicothoracic paraspinals, rhomboids/MT and LS    Myofascial Release manual TPR and pin & stretch to cervicothoracic paraspinals    Passive ROM manual cervical PROM into flexion with slight distraction, manual UT and LS stretches 2 x 30 sec    Manual Traction gentle  manual cervical distraction 5 x 30 sec      Neck Exercises: Stretches   Upper Trapezius Stretch Right;Left;30 seconds;1 rep    Levator Stretch Right;Left;30 seconds;1 rep                  PT Education - 08/16/20 1718    Education Details Role of TENS/estim and info on home TENS unit options    Person(s) Educated Patient    Methods Explanation;Handout    Comprehension Verbalized understanding            PT Short Term Goals - 08/07/20 1623      PT SHORT TERM GOAL #1   Title Independent with initial HEP    Status Achieved   07/31/20     PT SHORT TERM GOAL #2   Title Patient will verbalize/demonstrate understanding of neutral spine posture and proper body mechanics to reduce strain on lumbar spine    Status Achieved   08/07/20     PT SHORT TERM GOAL #3   Title Patient to report pain reduction in frequency and intensity by >/= 25%    Status Achieved    08/07/20 - Pt reports pain >50% better            PT Long Term Goals - 08/07/20 1628      PT LONG TERM GOAL #1   Title Independent with advanced HEP to support her abdomen and back    Time 8    Period Weeks    Status Partially Met      PT LONG TERM GOAL #2   Title Patient to report pain reduction in frequency and intensity by >/= 75%    Time 8    Period Weeks    Status On-going      PT LONG TERM GOAL #3   Title Patient to demonstrate ability to achieve and maintain good spinal alignment/posturing to reduce strain on lumbar spine and abdominal muscles    Time 8    Period Weeks    Status On-going      PT LONG TERM GOAL #4   Title Patient to improve thoracolumbar AROM to WNL without pain provocation while maintaining neutral SIJ alignment    Time 8    Period Weeks    Status On-going      PT LONG TERM GOAL #5   Title Patient to report ability to perform ADLs, household and childcare-related tasks without increased pain    Time 8    Period Weeks    Status On-going                 Plan - 08/16/20 1700    Clinical Impression Statement Kathy Nichols reporting increased upper back pain localized to C7-T2 vertebral levels following a sudden sharp pain experienced ~2 days ago when lifting her daughter in the car seat from the stroller into the car. Pain has remained at a constant low level since then but will increase up to 6-7/10 when she has to lift the car seat. Hypomobility and ttp noted in lower cervical and upper thoracic vertebrae along with increased muscle tension and ttp in cervicothoracic paraspinals. Manual therapy providing for improved mobility and good relief of pain. Reviewed cervical stretches and postural strengthening exercises from HEP to promote further relief of tension and postural support when lifting car seat at home. Session concluded with estim and moist heat to promote further muscle relaxation and pain relief. Pt provide with information on home TENS unit if  needed for further pain management at home. Kathy Nichols expressing interest in potentially increasing frequency of PT to 2x/wk, therefore will plan for reassessment next visit to determine need for frequency adjustment.    Comorbidities rectus diastasis, post-partum depression, migraines, recent COVID-19 infection    Rehab Potential Good    PT Frequency 1x / week   1x/wk due to limited childcare options   PT Duration 8 weeks    PT Treatment/Interventions ADLs/Self Care Home Management;Electrical Stimulation;Moist Heat;Ultrasound;Functional mobility training;Therapeutic activities;Therapeutic exercise;Neuromuscular re-education;Patient/family education;Manual techniques;Passive range of motion;Dry needling;Taping;Spinal Manipulations    PT Next Visit Plan reassessment for potential frequency adjustment to 2x/wk; lumbopelvic and upper back/neck flexibility and core/postural strengthening; manual therapy and modalities PRN; review of posture and body mechanics education PRN    PT Home Exercise Plan 8/4 - Kegel exercises; 8/10 - QL, HS, ITB & piriformis stretches, pelvic tilt, hook lying clam, TrA bridge + ball squeeze; 8/24 - UT, LS & doorway pec stretches, chin tuck, red TB rows/retraction    Consulted and Agree with Plan of Care Patient           Patient will benefit from skilled therapeutic intervention in order to improve the following deficits and impairments:  Abnormal gait, Decreased activity tolerance, Decreased endurance, Decreased knowledge of precautions, Decreased mobility, Decreased range of motion, Decreased strength, Difficulty walking, Increased fascial restricitons, Increased muscle spasms, Impaired perceived functional ability, Impaired flexibility, Impaired tone, Improper body mechanics, Postural dysfunction, Pain  Visit Diagnosis: Chronic bilateral low back pain without sciatica  Pain in thoracic spine  Muscle weakness (generalized)  Muscle spasm of back     Problem  List Patient Active Problem List   Diagnosis Date Noted  . Normal labor and delivery 04/07/2020  . Normal labor 04/07/2020  . 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 Family history of genetic disease 10/20/2019  . Supervision of other normal pregnancy, antepartum 09/20/2019    Percival Spanish, PT, MPT 08/16/2020, 6:52 PM  [facility] [address]  [place], [place], [address] Phone: [phone]   Fax:  [phone]  Name: Kathy Nichols MRN: [id number] Date of Birth: 1986-05-19

## 2020-08-16 NOTE — Patient Instructions (Signed)
TENS UNIT  This is helpful for muscle pain and spasm.   Search and Purchase a TENS 7000 2nd edition at www.tenspros.com or www.amazon.com  (It should be less than $30)     TENS unit instructions:   Do not shower or bathe with the unit on  Turn the unit off before removing electrodes or batteries  If the electrodes lose stickiness add a drop of water to the electrodes after they are disconnected from the unit and place on plastic sheet. If you continued to have difficulty, call the TENS unit company to purchase more electrodes.  Do not apply lotion on the skin area prior to use. Make sure the skin is clean and dry as this will help prolong the life of the electrodes.  After use, always check skin for unusual red areas, rash or other skin difficulties. If there are any skin problems, does not apply electrodes to the same area.  Never remove the electrodes from the unit by pulling the wires.  Do not use the TENS unit or electrodes other than as directed.  Do not change electrode placement without consulting your therapist or physician.  Keep 2 fingers with between each electrode.   TENS stands for Transcutaneous Electrical Nerve Stimulation. In other words, electrical impulses are allowed to pass through the skin in order to excite a nerve.   Purpose and Use of TENS:  TENS is a method used to manage acute and chronic pain without the use of drugs. It has been effective in managing pain associated with surgery, sprains, strains, trauma, rheumatoid arthritis, and neuralgias. It is a non-addictive, low risk, and non-invasive technique used to control pain. It is not, by any means, a curative form of treatment.   How TENS Works:  Most TENS units are a small pocket-sized unit powered by one 9 volt battery. Attached to the outside of the unit are two lead wires where two pins and/or snaps connect on each wire. All units come with a set of four reusable pads or electrodes. These are placed  on the skin surrounding the area involved. By inserting the leads into  the pads, the electricity can pass from the unit making the circuit complete.  As the intensity is turned up slowly, the electrical current enters the body from the electrodes through the skin to the surrounding nerve fibers. This triggers the release of hormones from within the body. These hormones contain pain relievers. By increasing the circulation of these hormones, the person's pain may be lessened. It is also believed that the electrical stimulation itself helps to block the pain messages being sent to the brain, thus also decreasing the body's perception of pain.   Hazards:  TENS units are NOT to be used by patients with PACEMAKERS, DEFIBRILLATORS, DIABETIC PUMPS, PREGNANT WOMEN, and patients with SEIZURE DISORDERS.  TENS units are NOT to be used over the heart, throat, brain, or spinal cord.  One of the major side effects from the TENS unit may be skin irritation. Some people may develop a rash if they are sensitive to the materials used in the electrodes or the connecting wires.   Wear the unit for up to 30-45 minutes at a time, 3-4x/day as needed.   Avoid overuse due the body getting used to the stem making it not as effective over time.    

## 2020-08-23 ENCOUNTER — Encounter: Payer: Self-pay | Admitting: Physical Therapy

## 2020-08-23 ENCOUNTER — Other Ambulatory Visit: Payer: Self-pay

## 2020-08-23 ENCOUNTER — Ambulatory Visit: Payer: BC Managed Care – PPO | Admitting: Physical Therapy

## 2020-08-23 DIAGNOSIS — M545 Low back pain: Secondary | ICD-10-CM | POA: Diagnosis not present

## 2020-08-23 DIAGNOSIS — M6281 Muscle weakness (generalized): Secondary | ICD-10-CM

## 2020-08-23 DIAGNOSIS — G8929 Other chronic pain: Secondary | ICD-10-CM

## 2020-08-23 DIAGNOSIS — M62830 Muscle spasm of back: Secondary | ICD-10-CM

## 2020-08-23 DIAGNOSIS — M546 Pain in thoracic spine: Secondary | ICD-10-CM

## 2020-08-23 NOTE — Therapy (Signed)
[facility] [place] [facility] [address]  [place], [place], [address] Phone: [phone]   Fax:  [phone]  Physical Therapy Treatment  Patient Details  Name: Kathy Nichols MRN: [id number] Date of Birth: Sep 01, 1986 Referring Provider (PT): Sloan Leiter, MD   Encounter Date: 08/23/2020   PT End of Session - 08/23/20 1620    Visit Number 7    Number of Visits 8    Date for PT Re-Evaluation 09/05/20    Authorization Type BCBS    PT Start Time 1620    PT Stop Time 1659    PT Time Calculation (min) 39 min    Activity Tolerance Patient tolerated treatment well    Behavior During Therapy [facility] for tasks assessed/performed           Past Medical History:  Diagnosis Date  . Iron deficiency anemia during pregnancy   . Migraine   . Rectus diastasis     Past Surgical History:  Procedure Laterality Date  . TONSILLECTOMY    . WISDOM TOOTH EXTRACTION      There were no vitals filed for this visit.   Subjective Assessment - 08/23/20 1623    Subjective Pt reports her pain from the exacerbation last week has gradually subsided and she is now better able to lift her daughter inthe carseat, but still having some pain. She notes she still has some stiffness when she goes to get up after sitting in the car line at school. She notes she has been able to increase her walking disance and add inclines w/o issue.    Patient Stated Goals "to increased flexibilty and mobility and build core strength"    Currently in Pain? Yes    Pain Score 1     Pain Location Thoracic    Pain Orientation Upper    Pain Descriptors / Indicators Sore    Pain Type Acute pain    Pain Frequency Intermittent                             [facility] Adult PT Treatment/Exercise - 08/23/20 1620      Neck Exercises: Theraband   Shoulder Extension 10 reps;Red    Shoulder Extension Limitations emphasis on scap retraction & neutral cervical spine; seated  on green Pball    Rows 10 reps;Red    Rows Limitations emphasis on scap retraction & neutral cervical spine; seated on green Pball    Horizontal ABduction 10 reps;Red    Horizontal ABduction Limitations cues for scap retraction; seated on green Pball    Other Theraband Exercises B red TB scap retraction + alt UE diagonals x 10 each; seated on green Pball      Lumbar Exercises: Stretches   Quadruped Mid Back Stretch 30 seconds;3 reps    Quadruped Mid Back Stretch Limitations 3-way child's pose/prayer stretch      Lumbar Exercises: Seated   Long Arc Quad on [place] Right;Left;10 reps    LAQ on [place] Limitations green Pball    Hip Flexion on Ball Right;Left;10 reps   2 sets   Hip Flexion on Ball Limitations green Pball; 2nd set + alt UE flexion    Other Seated Lumbar Exercises TrA + B red TB pallof press x 10; seated on green Pball      Lumbar Exercises: Quadruped   Single Arm Raise Right;Left;10 reps;5 seconds    Straight Leg Raise 10 reps;5 seconds  Opposite Arm/Leg Raise Right arm/Left leg;Left arm/Right leg;10 reps;5 seconds                    PT Short Term Goals - 08/07/20 1623      PT SHORT TERM GOAL #1   Title Independent with initial HEP    Status Achieved   07/31/20     PT SHORT TERM GOAL #2   Title Patient will verbalize/demonstrate understanding of neutral spine posture and proper body mechanics to reduce strain on lumbar spine    Status Achieved   08/07/20     PT SHORT TERM GOAL #3   Title Patient to report pain reduction in frequency and intensity by >/= 25%    Status Achieved   08/07/20 - Pt reports pain >50% better            PT Long Term Goals - 08/23/20 1722      PT LONG TERM GOAL #1   Title Independent with advanced HEP to support her abdomen and back    Time 8    Period Weeks    Status Partially Met    Target Date 09/05/20      PT LONG TERM GOAL #2   Title Patient to report pain reduction in frequency and intensity by >/= 75%    Time 8     Period Weeks    Status On-going    Target Date 09/05/20      PT LONG TERM GOAL #3   Title Patient to demonstrate ability to achieve and maintain good spinal alignment/posturing to reduce strain on lumbar spine and abdominal muscles    Time 8    Period Weeks    Status Partially Met    Target Date 09/05/20      PT LONG TERM GOAL #4   Title Patient to improve thoracolumbar AROM to WNL without pain provocation while maintaining neutral SIJ alignment    Time 8    Period Weeks    Status On-going    Target Date 09/05/20      PT LONG TERM GOAL #5   Title Patient to report ability to perform ADLs, household and childcare-related tasks without increased pain    Time 8    Period Weeks    Status Partially Met    Target Date 09/05/20                 Plan - 08/23/20 1659    Clinical Impression Statement Kathy Nichols reports pain from exacerbation last week mostly resolved and back to more of just the mild discomfort in the upper back that she had been previously experiencing. She notes functional improvements in ability to lift and carry her daughter in the car seat as well as improving walking tolerance including returning to walking on inclines w/o limitation due to LBP. She reports she has ordered a Swiss ball for exercises, so much of today's therapeutic exercises incorporating Pball for postural control with upper and lower back strengthening with pt noting improved ability to sense her core activation. One visit remaining in current POC, but given recent exacerbation of upper back pain, anticipate she may require recert to allow further time to address upper back pain and continue core and lumbopelvic strengthening and stability. Pt expressing interest in possibly increasing frequency to 2x/wk if recert necessary pending the availability of childcare as she has had to bring her infant daughter with her to the last 2 therapy sessions    Comorbidities rectus diastasis,  post-partum depression,  migraines, recent COVID-19 infection    Rehab Potential Good    PT Frequency 1x / week   1x/wk due to limited childcare options   PT Duration 8 weeks    PT Treatment/Interventions ADLs/Self Care Home Management;Electrical Stimulation;Moist Heat;Ultrasound;Functional mobility training;Therapeutic activities;Therapeutic exercise;Neuromuscular re-education;Patient/family education;Manual techniques;Passive range of motion;Dry needling;Taping;Spinal Manipulations    PT Next Visit Plan lumbar FOTO & goal assessment - probable recert with potential increased frequency to 2x/wk pending availability of child care; lumbopelvic and upper back/neck flexibility and core/postural strengthening; manual therapy and modalities PRN; review of posture and body mechanics education PRN    PT Home Exercise Plan 8/4 - Kegel exercises; 8/10 - QL, HS, ITB & piriformis stretches, pelvic tilt, hook lying clam, TrA bridge + ball squeeze; 8/24 - UT, LS & doorway pec stretches, chin tuck, red TB rows/retraction    Consulted and Agree with Plan of Care Patient           Patient will benefit from skilled therapeutic intervention in order to improve the following deficits and impairments:  Abnormal gait, Decreased activity tolerance, Decreased endurance, Decreased knowledge of precautions, Decreased mobility, Decreased range of motion, Decreased strength, Difficulty walking, Increased fascial restricitons, Increased muscle spasms, Impaired perceived functional ability, Impaired flexibility, Impaired tone, Improper body mechanics, Postural dysfunction, Pain  Visit Diagnosis: Chronic bilateral low back pain without sciatica  Pain in thoracic spine  Muscle weakness (generalized)  Muscle spasm of back     Problem List Patient Active Problem List   Diagnosis Date Noted  . Normal labor and delivery 04/07/2020  . Normal labor 04/07/2020  . 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 Family history of genetic disease 10/20/2019  .  Supervision of other normal pregnancy, antepartum 09/20/2019    Percival Spanish, PT, MPT 08/23/2020, 5:31 PM  [facility] [address]  [place], [place], [address] Phone: [phone]   Fax:  [phone]  Name: Kathy Nichols MRN: [id number] Date of Birth: 04/03/1986

## 2020-08-28 ENCOUNTER — Ambulatory Visit: Payer: BC Managed Care – PPO | Admitting: Physical Therapy

## 2020-08-28 ENCOUNTER — Encounter: Payer: Self-pay | Admitting: Physical Therapy

## 2020-08-28 ENCOUNTER — Other Ambulatory Visit: Payer: Self-pay

## 2020-08-28 DIAGNOSIS — M546 Pain in thoracic spine: Secondary | ICD-10-CM

## 2020-08-28 DIAGNOSIS — M545 Low back pain, unspecified: Secondary | ICD-10-CM

## 2020-08-28 DIAGNOSIS — M62830 Muscle spasm of back: Secondary | ICD-10-CM

## 2020-08-28 DIAGNOSIS — G8929 Other chronic pain: Secondary | ICD-10-CM

## 2020-08-28 DIAGNOSIS — M25551 Pain in right hip: Secondary | ICD-10-CM

## 2020-08-28 DIAGNOSIS — M6281 Muscle weakness (generalized): Secondary | ICD-10-CM

## 2020-08-28 NOTE — Therapy (Signed)
[facility] [address]  [place], [place], [address] Phone: [phone]   Fax:  [phone]  Physical Therapy Treatment / Recert  Patient Details  Name: Kathy Nichols MRN: [id number] Date of Birth: 10/08/1986 Referring Provider (PT): Sloan Leiter, MD  Progress Note  Reporting Period 07/11/2020 to 08/28/2020  See note below for Objective Data and Assessment of Progress/Goals.      Encounter Date: 08/28/2020   PT End of Session - 08/28/20 1618    Visit Number 8    Number of Visits 12    Date for PT Re-Evaluation 09/28/20    Authorization Type BCBS    PT Start Time 1618    PT Stop Time 1720    PT Time Calculation (min) 62 min    Activity Tolerance Patient tolerated treatment well    Behavior During Therapy WFL for tasks assessed/performed           Past Medical History:  Diagnosis Date  . Iron deficiency anemia during pregnancy   . Migraine   . Rectus diastasis     Past Surgical History:  Procedure Laterality Date  . TONSILLECTOMY    . WISDOM TOOTH EXTRACTION      There were no vitals filed for this visit.   Subjective Assessment - 08/28/20 1624    Subjective Pt reports new pain in R groin as of a few days ago - pain typically upon rising after sitting for a long period.    Patient Stated Goals "to increased flexibilty and mobility and build core strength"    Currently in Pain? Yes    Pain Score 1     Pain Location Thoracic    Pain Orientation Upper    Pain Descriptors / Indicators Sore    Pain Type Acute pain    Pain Frequency Intermittent    Aggravating Factors  sleeping position & nursing    Pain Relieving Factors stretches    Pain Score 0    Pain Location Back    Pain Orientation Lower    Pain Score 0   up to 8/10 upon rising and initial steps after prolonged sitting   Pain Location Groin    Pain Orientation Right    Pain Descriptors / Indicators Sharp   sudden   Pain Type Acute pain     Pain Onset In the past 7 days    Pain Frequency Intermittent    Aggravating Factors  rising after prolonged sitting, climbing stairs    Pain Relieving Factors "walk it off"              [facility] PT Assessment - 08/28/20 1618      Assessment   Medical Diagnosis Chronic B low back pain    Referring Provider (PT) Sloan Leiter, MD    Onset Date/Surgical Date --   during 3rd trimester of 2nd pregnancy ~4 yrs ago   Next MD Visit none scheduled      Prior Function   Level of Independence Independent    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   Leisure walking with kids in neighborhood - daily      AROM   Cervical Flexion 58    Cervical Extension 67    Cervical - Right Side Bend 35    Cervical - Left Side Bend 34    Cervical - Right Rotation 61    Cervical - Left Rotation 62    Lumbar Flexion WFL -  fingertips to toes    Lumbar Extension WNL    Lumbar - Right Side Bend WNL    Lumbar - Left Side Bend WNL    Lumbar - Right Rotation WNL    Lumbar - Left Rotation WNL      Strength   Right Shoulder Flexion 5/5    Right Shoulder ABduction 4+/5    Right Shoulder Internal Rotation 5/5    Right Shoulder External Rotation 5/5    Left Shoulder Flexion 5/5    Left Shoulder ABduction 4+/5    Left Shoulder Internal Rotation 5/5    Left Shoulder External Rotation 5/5    Right Hip Flexion 4+/5    Right Hip Extension 4+/5    Right Hip External Rotation  4/5    Right Hip Internal Rotation 4+/5    Right Hip ABduction 4+/5    Right Hip ADduction 4/5    Left Hip Flexion 5/5    Left Hip Extension 4/5    Left Hip External Rotation 4/5    Left Hip Internal Rotation 4+/5    Left Hip ABduction 4+/5    Left Hip ADduction 4+/5                         [facility] Adult PT Treatment/Exercise - 08/28/20 1618      Lumbar Exercises: Stretches   Hip Flexor Stretch Right;30 seconds;2 reps    Hip Flexor Stretch Limitations mod thomas with strap & seated lunge position over edge of chair - pt noting  better stretch with the latter    Other Lumbar Stretch Exercise R/B hip adductors stretches x 30 sec each - supine butterfly/frog leg, supine with strap & seated lateral lunge position - pt noting best stretch with the last option      Lumbar Exercises: Aerobic   Recumbent Bike L2 x 6 min      Knee/Hip Exercises: Seated   Ball Squeeze Hip ADD ball squeeze + red TB hip IR x 10      Manual Therapy   Manual Therapy Soft tissue mobilization;Myofascial release    Soft tissue mobilization STM/DTM to R hip adductors, distal iliopsoas and proximal iliacus - significant ttp over proximal adductors and iliopsoas but pt denies at proximal iliacus    Myofascial Release manual TPR to R proximal hip adductors & distal iliopsoas                     PT Short Term Goals - 08/07/20 1623      PT SHORT TERM GOAL #1   Title Independent with initial HEP    Status Achieved   07/31/20     PT SHORT TERM GOAL #2   Title Patient will verbalize/demonstrate understanding of neutral spine posture and proper body mechanics to reduce strain on lumbar spine    Status Achieved   08/07/20     PT SHORT TERM GOAL #3   Title Patient to report pain reduction in frequency and intensity by >/= 25%    Status Achieved   08/07/20 - Pt reports pain >50% better            PT Long Term Goals - 08/28/20 1632      PT LONG TERM GOAL #1   Title Independent with advanced HEP to support her abdomen and back    Status Partially Met    Target Date 09/28/20      PT LONG TERM GOAL #2  Title Patient to report pain reduction in frequency and intensity by >/= 75%    Status Partially Met   08/28/20 - 100% relief of LBP; 50-75% improvement in neck/upper thoracic pain   Target Date 09/28/20      PT LONG TERM GOAL #3   Title Patient to demonstrate ability to achieve and maintain good spinal alignment/posturing to reduce strain on lumbar spine and abdominal muscles    Status Partially Met   08/28/20 - good awareness of  intended posture but not fully consistent   Target Date 09/28/20      PT LONG TERM GOAL #4   Title Patient to improve thoracolumbar AROM to WNL without pain provocation while maintaining neutral SIJ alignment    Status Achieved   08/28/20     PT LONG TERM GOAL #5   Title Patient to report ability to perform ADLs, household and childcare-related tasks without increased pain    Status Partially Met   08/28/20 - still has upper thoracic/neck pain at times with nursing   Target Date 09/28/20                 Plan - 08/28/20 1720    Clinical Impression Statement Kathy Nichols has demonstrated good progress with PT, reporting resolution of low back pain and 50-75% improvement in upper back/thoracic pain, although noting new onset of R groin pain over past few days. New groin pain seems to be related increased muscle tension/tightness in R hip adductors/hip flexors with new stretches able to target the painful area in her groin. Her thoracolumbar ROM is now WNL, and improvement noted in cervical flexion and extension, but she still notes a sense of limited B cervical rotation. B shoulder strength now WNL at 4+/5 to 5/5 but some discomfort noted with resisted B shoulder abduction. B hip strength also improving with MMT 4/5 to 5/5. She is aware of proper posture and body mechanics for typical daily tasks but admits to inconsistent follow through at times with some daily tasks which continues to contribute to upper thoracic pain, such as when nursing her daughter.  All STGs met and LTGs met or at least partially met, but given ongoing upper thoracic pain and new groin pain and related functional deficits, will plan for recert for additional 1x/wk for up to 4 weeks.    Comorbidities rectus diastasis, post-partum depression, migraines, recent COVID-19 infection    Rehab Potential Good    PT Frequency 1x / week   1x/wk due to limited childcare options   PT Duration 4 weeks    PT Treatment/Interventions ADLs/Self  Care Home Management;Electrical Stimulation;Moist Heat;Ultrasound;Functional mobility training;Therapeutic activities;Therapeutic exercise;Neuromuscular re-education;Patient/family education;Manual techniques;Passive range of motion;Dry needling;Taping;Spinal Manipulations    PT Next Visit Plan lumbar FOTO; lumbopelvic and upper back/neck flexibility and core/postural strengthening; manual therapy and modalities PRN; review of posture and body mechanics education PRN    PT Home Exercise Plan 8/4 - Kegel exercises; 8/10 - QL, HS, ITB & piriformis stretches, pelvic tilt, hook lying clam, TrA bridge + ball squeeze; 8/24 - UT, LS & doorway pec stretches, chin tuck, red TB rows/retraction; 9/21 - cervical rotation SNAGs, hip flexor & adductor stretches, red TB hip IR    Consulted and Agree with Plan of Care Patient           Patient will benefit from skilled therapeutic intervention in order to improve the following deficits and impairments:  Abnormal gait, Decreased activity tolerance, Decreased endurance, Decreased knowledge of precautions, Decreased mobility, Decreased range  of motion, Decreased strength, Difficulty walking, Increased fascial restricitons, Increased muscle spasms, Impaired perceived functional ability, Impaired flexibility, Impaired tone, Improper body mechanics, Postural dysfunction, Pain  Visit Diagnosis: Chronic bilateral low back pain without sciatica - Plan: PT plan of care cert/re-cert  Pain in thoracic spine - Plan: PT plan of care cert/re-cert  Muscle weakness (generalized) - Plan: PT plan of care cert/re-cert  Muscle spasm of back - Plan: PT plan of care cert/re-cert  Pain in right hip - Plan: PT plan of care cert/re-cert     Problem List Patient Active Problem List   Diagnosis Date Noted  . Normal labor and delivery 04/07/2020  . Normal labor 04/07/2020  . 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 Family history of genetic disease 10/20/2019  . Supervision of other  normal pregnancy, antepartum 09/20/2019    Percival Spanish, PT, MPT 08/28/2020, 7:08 PM  [facility] [address]  [place], [place], [address] Phone: [phone]   Fax:  [phone]  Name: Kathy Nichols MRN: [id number] Date of Birth: 06-15-1986

## 2020-08-28 NOTE — Patient Instructions (Signed)
    Home exercise program created by Ashanty Coltrane, PT.  For questions, please contact Katlynne Mckercher via phone at [phone] or email at [email or web address]  [facility] [address]  [address] [place], [place], [address] Phone: [phone]   Fax:  [phone]    

## 2020-09-04 ENCOUNTER — Encounter: Payer: Self-pay | Admitting: Physical Therapy

## 2020-09-04 ENCOUNTER — Ambulatory Visit: Payer: BC Managed Care – PPO | Admitting: Physical Therapy

## 2020-09-04 ENCOUNTER — Other Ambulatory Visit: Payer: Self-pay

## 2020-09-04 DIAGNOSIS — M62830 Muscle spasm of back: Secondary | ICD-10-CM

## 2020-09-04 DIAGNOSIS — M25551 Pain in right hip: Secondary | ICD-10-CM

## 2020-09-04 DIAGNOSIS — M545 Low back pain, unspecified: Secondary | ICD-10-CM

## 2020-09-04 DIAGNOSIS — G8929 Other chronic pain: Secondary | ICD-10-CM

## 2020-09-04 DIAGNOSIS — M6281 Muscle weakness (generalized): Secondary | ICD-10-CM

## 2020-09-04 DIAGNOSIS — M546 Pain in thoracic spine: Secondary | ICD-10-CM

## 2020-09-04 NOTE — Therapy (Signed)
[facility] [address]  [place], [place], [address] Phone: [phone]   Fax:  [phone]  Physical Therapy Treatment  Patient Details  Name: Kathy Nichols MRN: [id number] Date of Birth: 03-May-1986 Referring Provider (PT): Sloan Leiter, MD   Encounter Date: 09/04/2020   PT End of Session - 09/04/20 1536    Visit Number 9    Number of Visits 12    Date for PT Re-Evaluation 09/28/20    Authorization Type BCBS    PT Start Time 1536    PT Stop Time 1618    PT Time Calculation (min) 42 min    Activity Tolerance Patient tolerated treatment well    Behavior During Therapy [facility] for tasks assessed/performed           Past Medical History:  Diagnosis Date  . Iron deficiency anemia during pregnancy   . Migraine   . Rectus diastasis     Past Surgical History:  Procedure Laterality Date  . TONSILLECTOMY    . WISDOM TOOTH EXTRACTION      There were no vitals filed for this visit.   Subjective Assessment - 09/04/20 1539    Subjective Pt feels like she can lift things better w/o as much pain. Still feels like the neck is bruised but she has been able to work on turning her head more. Hip/leg pain is much better but admits to not attempting the hip exercises.    Patient Stated Goals "to increased flexibilty and mobility and build core strength"    Currently in Pain? Yes    Pain Score 0-No pain   1-2/10 with L cervical rotation or up to 3-4/10 ttp   Pain Location Thoracic    Pain Orientation Upper    Pain Descriptors / Indicators Sore   "bruised"   Pain Type Acute pain    Pain Frequency Intermittent    Pain Score 0    Pain Location Back    Pain Score 0    Pain Location Groin    Pain Orientation Right    Pain Onset --              [facility] PT Assessment - 09/04/20 1536      Observation/Other Assessments   Focus on Therapeutic Outcomes (FOTO)  Lumbar - 94% (6% limitation)                          [facility] Adult PT Treatment/Exercise - 09/04/20 1536      Neck Exercises: Seated   Cervical Rotation Right;Left;10 reps    Cervical Rotation Limitations rotation SNAGs      Lumbar Exercises: Stretches   Hip Flexor Stretch Right;Left;30 seconds;2 reps    Hip Flexor Stretch Limitations seated lunge position over edge of chair    Other Lumbar Stretch Exercise R/L hip adductors stretch x 30 sec each - seated lateral lunge position       Lumbar Exercises: Aerobic   Nustep L4 x 6 min (UE/LE)      Knee/Hip Exercises: Seated   Ball Squeeze Hip ADD ball squeeze + red TB hip IR x 10                    PT Short Term Goals - 08/07/20 1623      PT SHORT TERM GOAL #1   Title Independent with initial HEP    Status Achieved   07/31/20  PT SHORT TERM GOAL #2   Title Patient will verbalize/demonstrate understanding of neutral spine posture and proper body mechanics to reduce strain on lumbar spine    Status Achieved   08/07/20     PT SHORT TERM GOAL #3   Title Patient to report pain reduction in frequency and intensity by >/= 25%    Status Achieved   08/07/20 - Pt reports pain >50% better            PT Long Term Goals - 08/28/20 1632      PT LONG TERM GOAL #1   Title Independent with advanced HEP to support her abdomen and back    Status Partially Met    Target Date 09/28/20      PT LONG TERM GOAL #2   Title Patient to report pain reduction in frequency and intensity by >/= 75%    Status Partially Met   08/28/20 - 100% relief of LBP; 50-75% improvement in neck/upper thoracic pain   Target Date 09/28/20      PT LONG TERM GOAL #3   Title Patient to demonstrate ability to achieve and maintain good spinal alignment/posturing to reduce strain on lumbar spine and abdominal muscles    Status Partially Met   08/28/20 - good awareness of intended posture but not fully consistent   Target Date 09/28/20      PT LONG TERM GOAL #4   Title Patient to  improve thoracolumbar AROM to WNL without pain provocation while maintaining neutral SIJ alignment    Status Achieved   08/28/20     PT LONG TERM GOAL #5   Title Patient to report ability to perform ADLs, household and childcare-related tasks without increased pain    Status Partially Met   08/28/20 - still has upper thoracic/neck pain at times with nursing   Target Date 09/28/20                 Plan - 09/04/20 1614    Clinical Impression Statement Kathy Nichols reports low back pain remains resolved and R hip/groin pain is much better, now only noting a bruised kind of soreness in the upper thoracic primarily with palpation or L cervical rotation. She admits to not having attempted new exercises since last visit, therefore review latest HEP update with pt demonstrating good recall of hip stretches but requiring cues for hip IR strengthening as well as cervical rotation SNAGs. Discussed options for self-STM to address ongoing "bruised soreness" using tennis ball on wall or Theracane as pt notes pressure in this area seems to help. Pt denies issues or need for review of remaining HEP but again admits to recent poor compliance - requested pt attempt upper body strengthening exercise before next visit to determine need for modification or progression of theraband resistance.    Comorbidities rectus diastasis, post-partum depression, migraines, recent COVID-19 infection    Rehab Potential Good    PT Frequency 1x / week   1x/wk due to limited childcare options   PT Duration 4 weeks    PT Treatment/Interventions ADLs/Self Care Home Management;Electrical Stimulation;Moist Heat;Ultrasound;Functional mobility training;Therapeutic activities;Therapeutic exercise;Neuromuscular re-education;Patient/family education;Manual techniques;Passive range of motion;Dry needling;Taping;Spinal Manipulations    PT Next Visit Plan upper back/neck and lumbopelvic flexibility and core/postural strengthening; manual therapy and  modalities PRN; review of posture and body mechanics education PRN    PT Home Exercise Plan 8/4 - Kegel exercises; 8/10 - QL, HS, ITB & piriformis stretches, pelvic tilt, hook lying clam, TrA bridge + ball squeeze;  8/24 - UT, LS & doorway pec stretches, chin tuck, red TB rows/retraction; 9/21 - cervical rotation SNAGs, hip flexor & adductor stretches, red TB hip IR    Consulted and Agree with Plan of Care Patient           Patient will benefit from skilled therapeutic intervention in order to improve the following deficits and impairments:  Abnormal gait, Decreased activity tolerance, Decreased endurance, Decreased knowledge of precautions, Decreased mobility, Decreased range of motion, Decreased strength, Difficulty walking, Increased fascial restricitons, Increased muscle spasms, Impaired perceived functional ability, Impaired flexibility, Impaired tone, Improper body mechanics, Postural dysfunction, Pain  Visit Diagnosis: Chronic bilateral low back pain without sciatica  Pain in thoracic spine  Muscle weakness (generalized)  Muscle spasm of back  Pain in right hip     Problem List Patient Active Problem List   Diagnosis Date Noted  . Normal labor and delivery 04/07/2020  . Normal labor 04/07/2020  . 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 Family history of genetic disease 10/20/2019  . Supervision of other normal pregnancy, antepartum 09/20/2019    Percival Spanish, PT, MPT 09/04/2020, 7:06 PM  [facility] [address]  [place], [place], [address] Phone: [phone]   Fax:  [phone]  Name: Kathy Nichols MRN: [id number] Date of Birth: [birth year]

## 2020-09-05 IMAGING — US US MFM OB DETAIL+14 WK
1 series · 13 of 28 positions shown · non-contrast
Comparison: none

[Series 1: us mfm ob detail+14 wk · 13 of 136 slices shown]
[im 6/136]
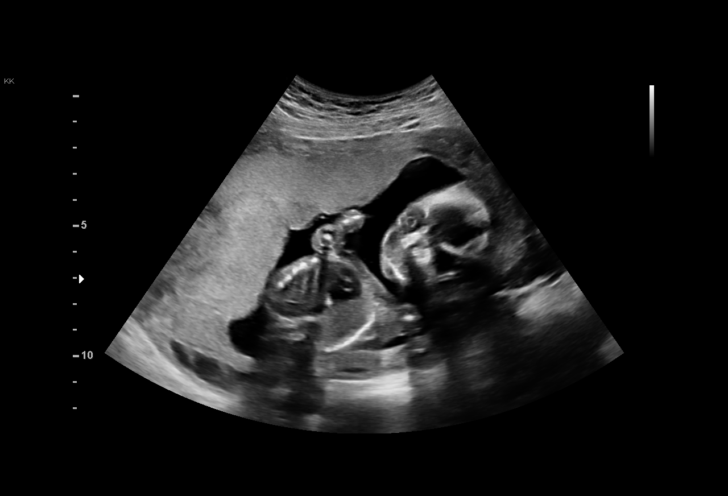
[im 16/136]
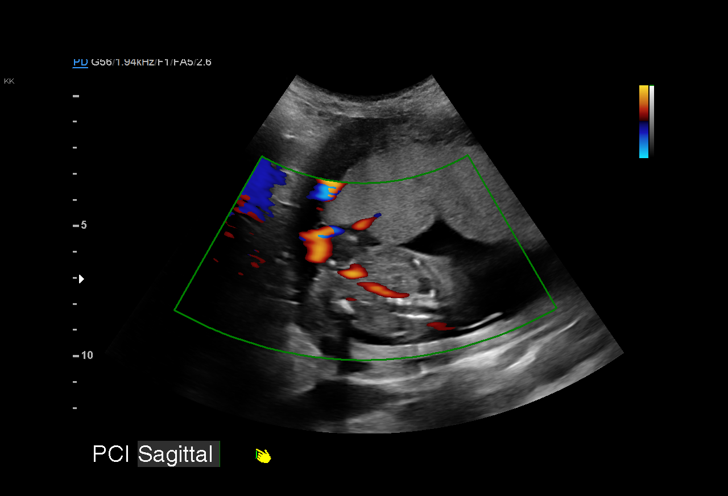
[im 26/136]
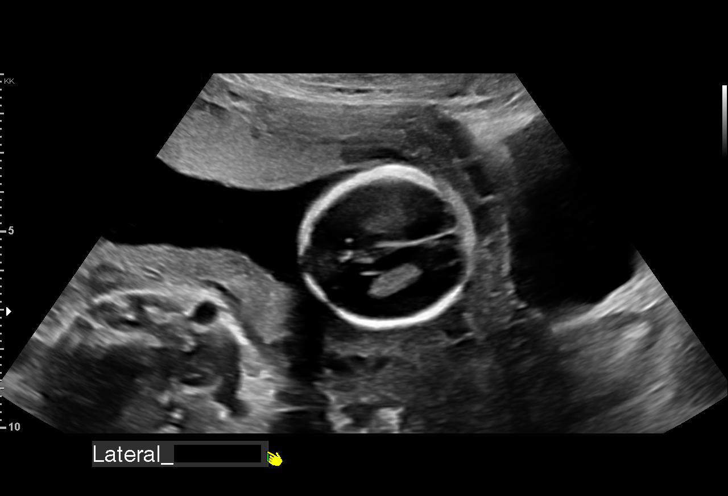
[im 36/136]
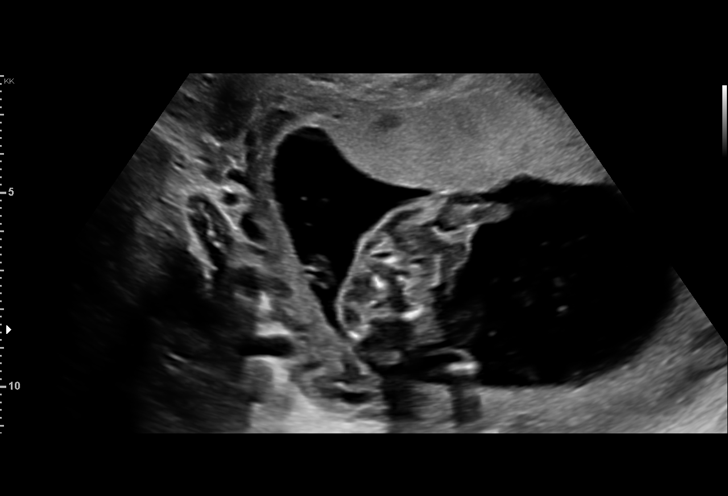
[im 46/136]
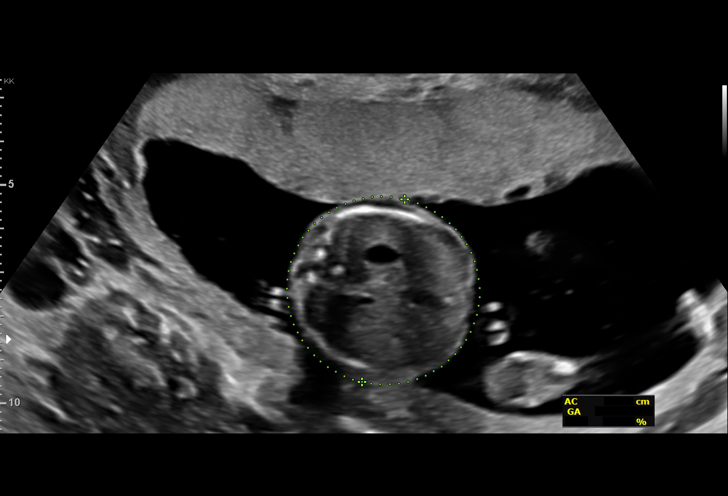
[im 56/136]
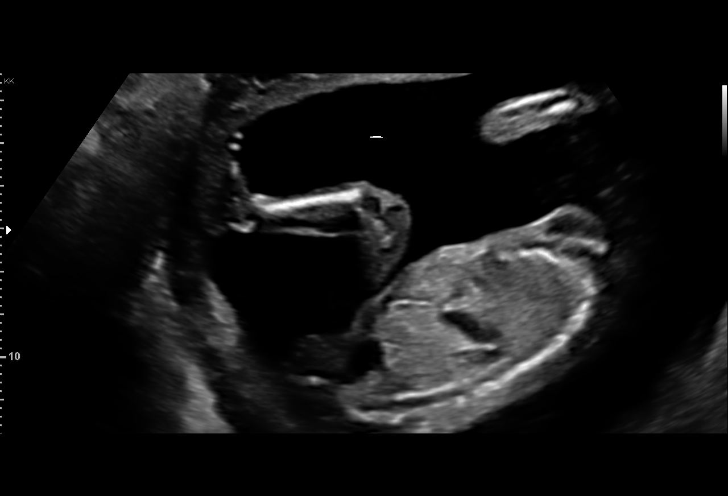
[im 71/136]
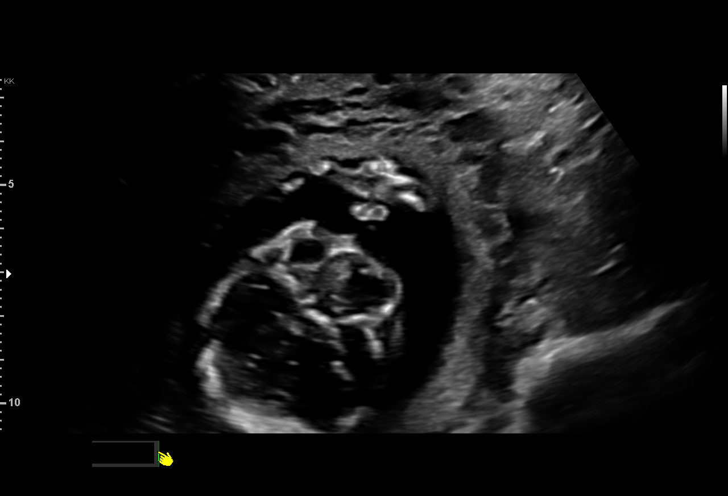
[im 81/136]
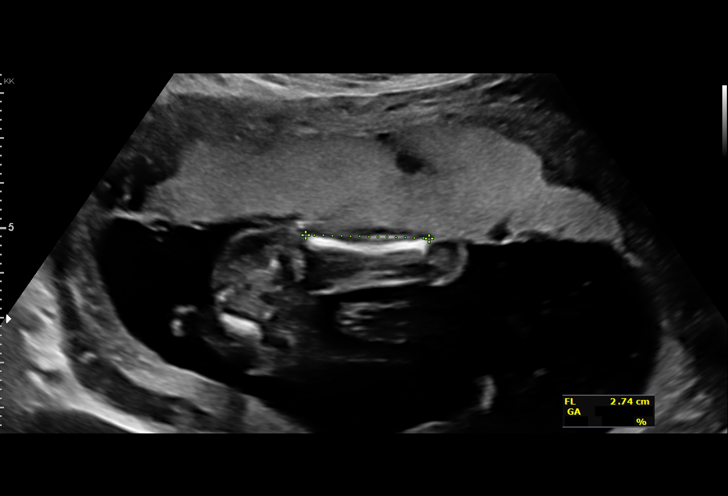
[im 91/136]
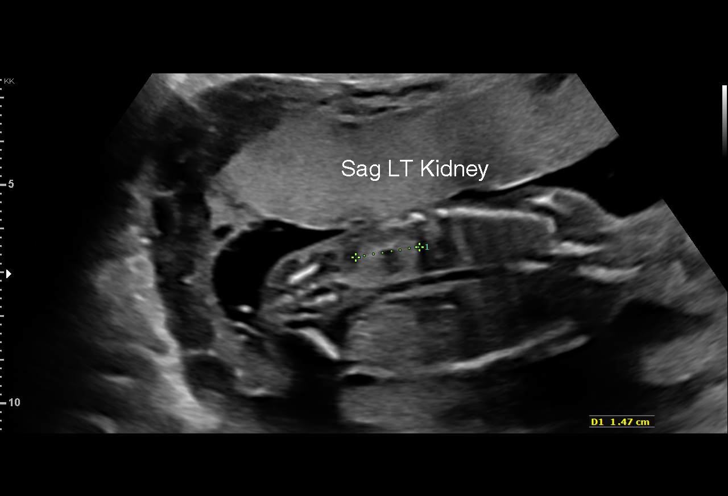
[im 101/136]
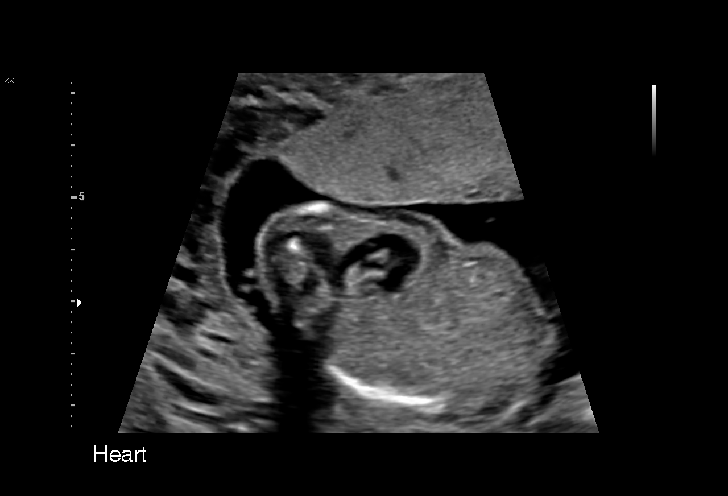
[im 111/136]
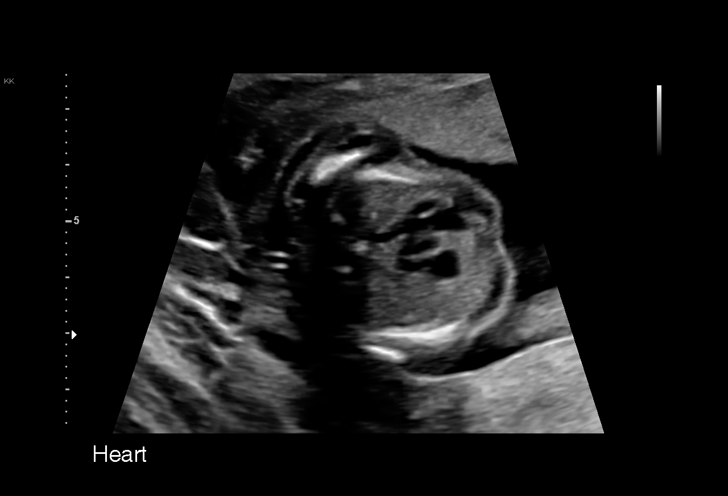
[im 121/136]
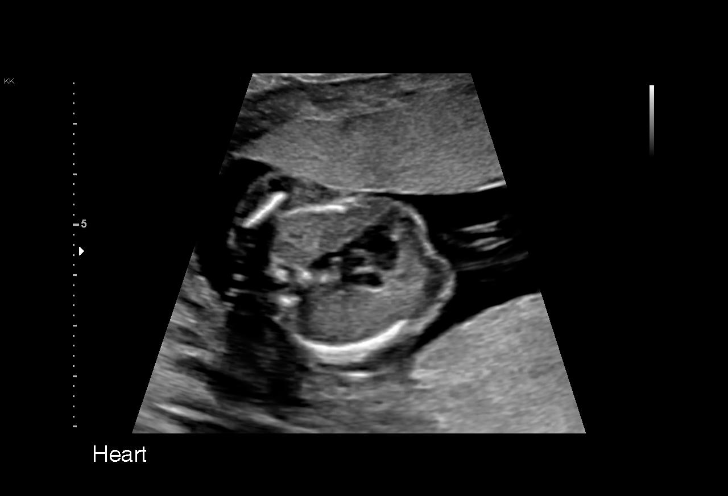
[im 131/136]
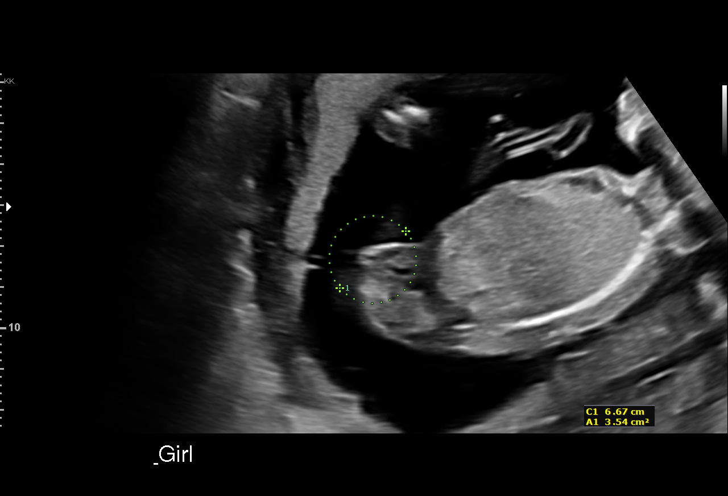

[13 of 28 positions shown; findings below may reference images not displayed]

[id number]

 ----------------------------------------------------------------------

 ----------------------------------------------------------------------
Indications

  Marginal insertion of umbilical cord affecting
  management of mother in second trimester
  Encounter for antenatal screening for
  malformations
  Family history of genetic disorder
  18 weeks gestation of pregnancy
 ----------------------------------------------------------------------
Fetal Evaluation

 Num Of Fetuses:         1
 Fetal Heart Rate(bpm):  146
 Cardiac Activity:       Observed
 Presentation:           Cephalic
 Placenta:               Anterior
 P. Cord Insertion:      Marginal insertion

 Amniotic Fluid
 AFI FV:      Within normal limits

                             Largest Pocket(cm)

Biometry

 BPD:      42.2  mm     G. Age:  18w 5d         66  %    CI:        77.63   %    70 - 86
                                                         FL/HC:      18.1   %    15.8 - 18
 HC:      151.6  mm     G. Age:  18w 1d         30  %    HC/AC:      1.11        1.07 -
 AC:      136.9  mm     G. Age:  19w 1d         70  %    FL/BPD:     65.2   %
 FL:       27.5  mm     G. Age:  18w 3d         43  %    FL/AC:      20.1   %    20 - 24
 HUM:      27.7  mm     G. Age:  18w 6d         66  %
 NFT:       3.9  mm

 Est. FW:     254  gm      0 lb 9 oz     64  %
OB History

 Gravidity:    3         Term:   2        Prem:   0        SAB:   0
 TOP:          0       Ectopic:  0        Living: 2
Gestational Age

 LMP:           18w 3d        Date:  07/09/19                 EDD:   04/14/20
 U/S Today:     18w 4d                                        EDD:   04/13/20
 Best:          18w 3d     Det. By:  LMP  (07/09/19)          EDD:   04/14/20
Anatomy

 Cranium:               Appears normal         LVOT:                   Appears normal
 Cavum:                 Appears normal         Aortic Arch:            Appears normal
 Ventricles:            Appears normal         Ductal Arch:            Appears normal
 Choroid Plexus:        Appears normal         Diaphragm:              Appears normal
 Cerebellum:            Appears normal         Stomach:                Appears normal, left
                                                                       sided
 Posterior Fossa:       Appears normal         Abdomen:                Appears normal
 Nuchal Fold:           Appears normal         Abdominal Wall:         Appears nml (cord
                                                                       insert, abd wall)
 Face:                  Appears normal         Cord Vessels:           Appears normal (3
                        (orbits and profile)                           vessel cord)
 Lips:                  Appears normal         Kidneys:                Appear normal
 Palate:                Appears normal         Bladder:                Appears normal
 Thoracic:              Appears normal         Spine:                  Appears normal
 Heart:                 Appears normal         Upper Extremities:      Appears normal
                        (4CH, axis, and
                        situs)
 RVOT:                  Appears normal         Lower Extremities:      Appears normal

 Other:  Heels visualized. Technically difficult due to fetal position.
Cervix Uterus Adnexa

 Cervix
 Length:            3.7  cm.
 Normal appearance by transabdominal scan.
Comments

 This patient was seen for a detailed fetal anatomy scan. She
 denies any significant past medical history and denies any
 problems in her current pregnancy.
 The patient has not had any screening tests for fetal
 aneuploidy drawn in her current pregnancy.  She is
 scheduled to have a quad screen drawn following today's
 ultrasound appointment.
 She was informed that the fetal growth and amniotic fluid
 level were appropriate for her gestational age.
 There were no obvious fetal anomalies noted on today's
 ultrasound exam.  However, today's exam was limited due to
 the fetal position.
 The patient was informed that anomalies may be missed due
 to technical limitations. If the fetus is in a suboptimal position
 or maternal habitus is increased, visualization of the fetus in
 the maternal uterus may be impaired.
 A possible marginal placental cord insertion was noted today.
 The increased risk of fetal growth issues later in her
 pregnancy due to the marginal placental cord insertion was
 discussed.  Due to this finding, we will continue to follow her
 with serial growth ultrasounds.
 A follow-up exam was scheduled in 4 weeks to obtain better
 views of the fetal anatomy and to assess the fetal growth.

## 2020-09-10 ENCOUNTER — Ambulatory Visit: Payer: BC Managed Care – PPO | Admitting: Physical Therapy

## 2020-09-20 ENCOUNTER — Encounter: Payer: Self-pay | Admitting: Physical Therapy

## 2020-09-20 ENCOUNTER — Other Ambulatory Visit: Payer: Self-pay

## 2020-09-20 ENCOUNTER — Ambulatory Visit: Payer: BC Managed Care – PPO | Attending: Obstetrics and Gynecology | Admitting: Physical Therapy

## 2020-09-20 DIAGNOSIS — M545 Low back pain, unspecified: Secondary | ICD-10-CM | POA: Diagnosis present

## 2020-09-20 DIAGNOSIS — M546 Pain in thoracic spine: Secondary | ICD-10-CM | POA: Diagnosis present

## 2020-09-20 DIAGNOSIS — M6281 Muscle weakness (generalized): Secondary | ICD-10-CM | POA: Insufficient documentation

## 2020-09-20 DIAGNOSIS — M25551 Pain in right hip: Secondary | ICD-10-CM | POA: Diagnosis present

## 2020-09-20 DIAGNOSIS — M62830 Muscle spasm of back: Secondary | ICD-10-CM

## 2020-09-20 DIAGNOSIS — G8929 Other chronic pain: Secondary | ICD-10-CM | POA: Diagnosis present

## 2020-09-20 NOTE — Therapy (Signed)
[facility] [address]  [place], [place], [address] Phone: [phone]   Fax:  [phone]  Physical Therapy Treatment / Progress Note / Recert  Patient Details  Name: Kathy Nichols MRN: [id number] Date of Birth: 01/13/1986 Referring Provider (PT): Sloan Leiter, MD  Progress Note  Reporting Period 07/11/2020 to 09/20/2020  See note below for Objective Data and Assessment of Progress/Goals.      Encounter Date: 09/20/2020   PT End of Session - 09/20/20 1017    Visit Number 10    Number of Visits 12    Date for PT Re-Evaluation 10/18/20    Authorization Type BCBS    PT Start Time 1017    PT Stop Time 1102    PT Time Calculation (min) 45 min    Activity Tolerance Patient tolerated treatment well    Behavior During Therapy WFL for tasks assessed/performed           Past Medical History:  Diagnosis Date  . Iron deficiency anemia during pregnancy   . Migraine   . Rectus diastasis     Past Surgical History:  Procedure Laterality Date  . TONSILLECTOMY    . WISDOM TOOTH EXTRACTION      There were no vitals filed for this visit.   Subjective Assessment - 09/20/20 1020    Subjective Pt reports "pain is gone" but still notes "slight tightness" in upper back when lifting her dtr in the car seat.    Currently in Pain? No/denies              [facility] PT Assessment - 09/20/20 1017      Assessment   Medical Diagnosis Chronic B low back pain    Referring Provider (PT) Sloan Leiter, MD    Onset Date/Surgical Date --   during 3rd trimester of 2nd pregnancy ~4 yrs ago   Next MD Visit none scheduled      Strength   Right Shoulder Flexion 5/5    Right Shoulder ABduction 5/5    Right Shoulder Internal Rotation 5/5    Right Shoulder External Rotation 5/5    Left Shoulder Flexion 5/5    Left Shoulder ABduction 4+/5    Left Shoulder Internal Rotation 5/5    Left Shoulder External Rotation 5/5    Right  Hip Flexion 5/5    Right Hip Extension 4+/5    Right Hip External Rotation  4+/5    Right Hip Internal Rotation 5/5    Right Hip ABduction 4+/5    Right Hip ADduction 4+/5    Left Hip Flexion 5/5    Left Hip Extension 4+/5    Left Hip External Rotation 4/5    Left Hip Internal Rotation 4+/5    Left Hip ABduction 4+/5    Left Hip ADduction 4+/5                         [facility] Adult PT Treatment/Exercise - 09/20/20 1017      Lumbar Exercises: Aerobic   Tread Mill 2.0 mph x 6 min      Lumbar Exercises: Standing   Scapular Retraction Both;10 reps;Strengthening;Theraband    Theraband Level (Scapular Retraction) Level 3 (Green)    Scapular Retraction Limitations low rows/retraction with cues for abdominal bracing    Row Both;10 reps;Strengthening;Theraband    Theraband Level (Row) Level 3 (Green)    Row Limitations cues for abdominal bracing and scap  retraction      Neck Exercises: Stretches   Upper Trapezius Stretch Right;Left;30 seconds;3 reps    Upper Trapezius Stretch Limitations w/ and w/o light overpressure with opposite hand    Levator Stretch Right;Left;30 seconds;1 rep                  PT Education - 09/20/20 1100    Education Details HEP review/progression + instructions for physioball exercises per pt request    Person(s) Educated Patient    Methods Explanation;Demonstration;Verbal cues;Handout    Comprehension Verbalized understanding;Verbal cues required;Returned demonstration;Need further instruction            PT Short Term Goals - 08/07/20 1623      PT SHORT TERM GOAL #1   Title Independent with initial HEP    Status Achieved   07/31/20     PT SHORT TERM GOAL #2   Title Patient will verbalize/demonstrate understanding of neutral spine posture and proper body mechanics to reduce strain on lumbar spine    Status Achieved   08/07/20     PT SHORT TERM GOAL #3   Title Patient to report pain reduction in frequency and intensity by >/= 25%     Status Achieved   08/07/20 - Pt reports pain >50% better            PT Long Term Goals - 09/20/20 1022      PT LONG TERM GOAL #1   Title Independent with advanced HEP to support her abdomen and back    Status Partially Met    Target Date 10/18/20      PT LONG TERM GOAL #2   Title Patient to report pain reduction in frequency and intensity by >/= 75%    Status Achieved   09/20/20 - 100% relief of LBP; 75-85% improvement in neck/upper thoracic pain     PT LONG TERM GOAL #3   Title Patient to demonstrate ability to achieve and maintain good spinal alignment/posturing to reduce strain on lumbar spine and abdominal muscles    Status Achieved   09/20/20     PT LONG TERM GOAL #4   Title Patient to improve thoracolumbar AROM to WNL without pain provocation while maintaining neutral SIJ alignment    Status Achieved   08/28/20     PT LONG TERM GOAL #5   Title Patient to report ability to perform ADLs, household and childcare-related tasks without increased pain    Status Partially Met   09/20/20 - still has upper thoracic/neck tightness with lfting carseat or heavy objects   Target Date 10/18/20                 Plan - 09/20/20 1102    Clinical Impression Statement Kathy Nichols reports her pain is mostly resolved only noting intermittent tightness in upper back/lower neck with lifting heavy objects like a heavy laundry basket or her daughter in her carseat. She requested review of stretches for neck/upper shoulders as she feels that she is not able to adequately achieve a stretch in the areas that are still tight - reviewed UT and LS stretches correcting positioning and providing instruction in slight overpressure to intensify stretches with pt noting better comfort and success with stretches. Pt also noting that red TB is getting easy with rows/retractions, therefore progressed to green TB with good tolerance. Pt requesting home instructions for physioball exercises as she has recently  obtained a ball and would like to try the exercises we have done in PT  at home. Overall, Kathy Nichols is progressing well with PT with all goal met or partially met - she would like to reduce frequency to biweekly as we work toward transition to HEP, therefore POC modified accordingly.    Comorbidities rectus diastasis, post-partum depression, migraines, recent COVID-19 infection    Rehab Potential Good    PT Frequency Biweekly   1x/wk due to limited childcare options   PT Duration 4 weeks    PT Treatment/Interventions ADLs/Self Care Home Management;Electrical Stimulation;Moist Heat;Ultrasound;Functional mobility training;Therapeutic activities;Therapeutic exercise;Neuromuscular re-education;Patient/family education;Manual techniques;Passive range of motion;Dry needling;Taping;Spinal Manipulations    PT Next Visit Plan upper back/neck and lumbopelvic flexibility and core/postural strengthening; manual therapy and modalities PRN; review of posture and body mechanics education PRN    PT Home Exercise Plan 8/4 - Kegel exercises; 8/10 - QL, HS, ITB & piriformis stretches, pelvic tilt, hook lying clam, TrA bridge + ball squeeze; 8/24 - UT, LS & doorway pec stretches, chin tuck, red TB rows/retraction; 9/21 - cervical rotation SNAGs, hip flexor & adductor stretches, red TB hip IR    Consulted and Agree with Plan of Care Patient           Patient will benefit from skilled therapeutic intervention in order to improve the following deficits and impairments:  Abnormal gait, Decreased activity tolerance, Decreased endurance, Decreased knowledge of precautions, Decreased mobility, Decreased range of motion, Decreased strength, Difficulty walking, Increased fascial restricitons, Increased muscle spasms, Impaired perceived functional ability, Impaired flexibility, Impaired tone, Improper body mechanics, Postural dysfunction, Pain  Visit Diagnosis: Chronic bilateral low back pain without sciatica  Pain in thoracic  spine  Muscle weakness (generalized)  Muscle spasm of back  Pain in right hip     Problem List Patient Active Problem List   Diagnosis Date Noted  . Normal labor and delivery 04/07/2020  . Normal labor 04/07/2020  . 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 Family history of genetic disease 10/20/2019  . Supervision of other normal pregnancy, antepartum 09/20/2019    Percival Spanish, PT, MPT 09/20/2020, 12:51 PM  [facility] [address]  [place], [place], [address] Phone: [phone]   Fax:  [phone]  Name: Salena Ortlieb MRN: [id number] Date of Birth: 10/10/1986

## 2020-09-27 ENCOUNTER — Encounter: Payer: Self-pay | Admitting: Physical Therapy

## 2020-10-02 ENCOUNTER — Encounter: Payer: Self-pay | Admitting: Physical Therapy

## 2020-10-02 ENCOUNTER — Ambulatory Visit: Payer: BC Managed Care – PPO | Admitting: Physical Therapy

## 2020-10-02 ENCOUNTER — Other Ambulatory Visit: Payer: Self-pay

## 2020-10-02 DIAGNOSIS — M25551 Pain in right hip: Secondary | ICD-10-CM

## 2020-10-02 DIAGNOSIS — M62830 Muscle spasm of back: Secondary | ICD-10-CM

## 2020-10-02 DIAGNOSIS — G8929 Other chronic pain: Secondary | ICD-10-CM

## 2020-10-02 DIAGNOSIS — M546 Pain in thoracic spine: Secondary | ICD-10-CM

## 2020-10-02 DIAGNOSIS — M545 Low back pain, unspecified: Secondary | ICD-10-CM

## 2020-10-02 DIAGNOSIS — M6281 Muscle weakness (generalized): Secondary | ICD-10-CM

## 2020-10-02 NOTE — Therapy (Addendum)
[facility] [address]  [place], [place], [address] Phone: [phone]   Fax:  [phone]  Physical Therapy Treatment / Progress Note / Discharge Summary  Patient Details  Name: Kathy Nichols MRN: [id number] Date of Birth: 05-16-1986 Referring Provider (PT): Sloan Leiter, MD  Progress Note  Reporting Period 07/11/2020 to 10/02/2020  See note below for Objective Data and Assessment of Progress/Goals.      Encounter Date: 10/02/2020   PT End of Session - 10/02/20 1110    Visit Number 11    Number of Visits 12    Date for PT Re-Evaluation 10/18/20    Authorization Type [employer]    PT Start Time 1110   Pt arrived late   PT Stop Time 1134    PT Time Calculation (min) 24 min    Activity Tolerance Patient tolerated treatment well    Behavior During Therapy WFL for tasks assessed/performed           Past Medical History:  Diagnosis Date  . Iron deficiency anemia during pregnancy   . Migraine   . Rectus diastasis     Past Surgical History:  Procedure Laterality Date  . TONSILLECTOMY    . WISDOM TOOTH EXTRACTION      There were no vitals filed for this visit.   Subjective Assessment - 10/02/20 1114    Subjective Pt denies pain today, nor any recent issues with neck or back.    Currently in Pain? No/denies              [facility] PT Assessment - 10/02/20 1110      Assessment   Medical Diagnosis Chronic B low back pain    Referring Provider (PT) Sloan Leiter, MD    Onset Date/Surgical Date --   during 3rd trimester of 2nd pregnancy ~4 yrs ago   Next MD Visit none scheduled      Observation/Other Assessments   Focus on Therapeutic Outcomes (FOTO)  Lumbar - 94% (6% limitation)      Strength   Right Shoulder Flexion 5/5    Right Shoulder ABduction 5/5    Right Shoulder Internal Rotation 5/5    Right Shoulder External Rotation 5/5    Left Shoulder Flexion 5/5    Left Shoulder ABduction 5/5     Left Shoulder Internal Rotation 5/5    Left Shoulder External Rotation 5/5    Right Hip Flexion 5/5    Right Hip Extension 5/5    Right Hip External Rotation  5/5    Right Hip Internal Rotation 5/5    Right Hip ABduction 5/5    Right Hip ADduction 5/5    Left Hip Flexion 5/5    Left Hip Extension 5/5    Left Hip External Rotation 5/5    Left Hip Internal Rotation 5/5    Left Hip ABduction 5/5    Left Hip ADduction 5/5                         [facility] Adult PT Treatment/Exercise - 10/02/20 1100      Lumbar Exercises: Aerobic   Recumbent Bike L2 x 6 min                    PT Short Term Goals - 08/07/20 1623      PT SHORT TERM GOAL #1   Title Independent with initial HEP    Status Achieved  07/31/20     PT SHORT TERM GOAL #2   Title Patient will verbalize/demonstrate understanding of neutral spine posture and proper body mechanics to reduce strain on lumbar spine    Status Achieved   08/07/20     PT SHORT TERM GOAL #3   Title Patient to report pain reduction in frequency and intensity by >/= 25%    Status Achieved   08/07/20 - Pt reports pain >50% better            PT Long Term Goals - 10/02/20 1120      PT LONG TERM GOAL #1   Title Independent with advanced HEP to support her abdomen and back    Status Achieved   10/02/20     PT LONG TERM GOAL #2   Title Patient to report pain reduction in frequency and intensity by >/= 75%    Status Achieved   09/20/20 - 100% relief of LBP; 75-85% improvement in neck/upper thoracic pain     PT LONG TERM GOAL #3   Title Patient to demonstrate ability to achieve and maintain good spinal alignment/posturing to reduce strain on lumbar spine and abdominal muscles    Status Achieved   09/20/20     PT LONG TERM GOAL #4   Title Patient to improve thoracolumbar AROM to WNL without pain provocation while maintaining neutral SIJ alignment    Status Achieved   08/28/20     PT LONG TERM GOAL #5   Title Patient to  report ability to perform ADLs, household and childcare-related tasks without increased pain    Status Achieved   10/02/20                Plan - 10/02/20 1116    Clinical Impression Statement Kathy Nichols reports no recent neck or low back pain and has been able to resume all of her normal daily activities and childcare tasks without limitation including being able to lift her daughter in the car seat as well as lifting and carrying heavy laundry baskets and walking [place] the [place] with her [place] on the weekend. Strength now 5/5 for B UE and LE. Kathy Nichols is independent with her ongoing HEP and denies need for further review. All goals now met and pt ready to transition to the HEP but would like to remain on hold for 30-days in case issues or questions arise as she continues on her own with the HEP.    Comorbidities rectus diastasis, post-partum depression, migraines, recent COVID-19 infection    Rehab Potential Good    PT Frequency --   1x/wk due to limited childcare options   PT Duration --    PT Treatment/Interventions ADLs/Self Care Home Management;Electrical Stimulation;Moist Heat;Ultrasound;Functional mobility training;Therapeutic activities;Therapeutic exercise;Neuromuscular re-education;Patient/family education;Manual techniques;Passive range of motion;Dry needling;Taping;Spinal Manipulations    PT Next Visit Plan transition to HEP + 30-day hold    PT Home Exercise Plan 8/4 - Kegel exercises; 8/10 - QL, HS, ITB & piriformis stretches, pelvic tilt, hook lying clam, TrA bridge + ball squeeze; 8/24 - UT, LS & doorway pec stretches, chin tuck, red TB rows/retraction; 9/21 - cervical rotation SNAGs, hip flexor & adductor stretches, red TB hip IR    Consulted and Agree with Plan of Care Patient           Patient will benefit from skilled therapeutic intervention in order to improve the following deficits and impairments:  Abnormal gait, Decreased activity tolerance, Decreased endurance, Decreased  knowledge of precautions, Decreased mobility, Decreased  range of motion, Decreased strength, Difficulty walking, Increased fascial restricitons, Increased muscle spasms, Impaired perceived functional ability, Impaired flexibility, Impaired tone, Improper body mechanics, Postural dysfunction, Pain  Visit Diagnosis: Chronic bilateral low back pain without sciatica  Pain in thoracic spine  Muscle weakness (generalized)  Muscle spasm of back  Pain in right hip     Problem List Patient Active Problem List   Diagnosis Date Noted  . Normal labor and delivery 04/07/2020  . Normal labor 04/07/2020  . 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 Family history of genetic disease 10/20/2019  . Supervision of other normal pregnancy, antepartum 09/20/2019    Percival Spanish, PT, MPT 10/02/2020, 11:41 AM  [facility] [address]  [place], [place], [address] Phone: [phone]   Fax:  [phone]  Name: Kathy Nichols MRN: [id number] Date of Birth: 13-Jun-1986   PHYSICAL THERAPY DISCHARGE SUMMARY  Visits from Start of Care: 11  Current functional level related to goals / functional outcomes:   Refer to above clinical impression for status as of last visit on 10/02/2020. Patient was placed on hold for 30 days and has not needed to return to PT, therefore will proceed with discharge from PT for this episode.   Remaining deficits:   As above.   Education / Equipment:   HEP  Plan: Patient agrees to discharge.  Patient goals were met. Patient is being discharged due to meeting the stated rehab goals.  ?????     Percival Spanish, PT, MPT 11/09/20, 9:58 AM  [facility] [address]  [place], [place], [address] Phone: [phone]   Fax:  [phone]

## 2020-10-31 IMAGING — US US MFM OB FOLLOW-UP
1 series · 13 of 28 positions shown · non-contrast
Comparison: none

[Series 1: us mfm ob follow-up · 13 of 39 slices shown]
[im 2/39]
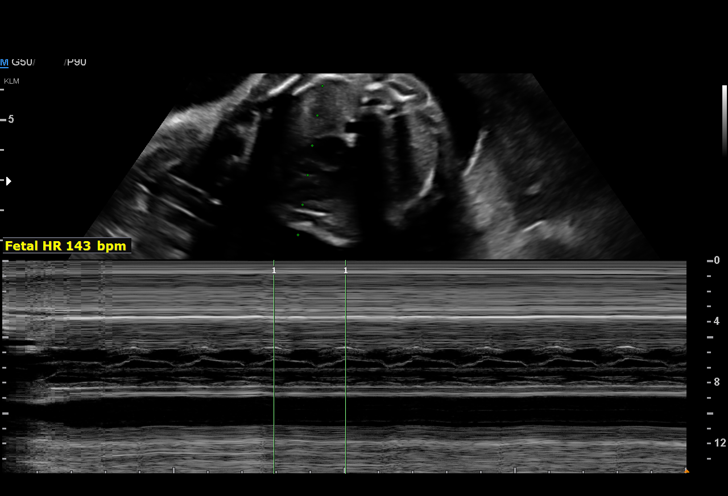
[im 5/39]
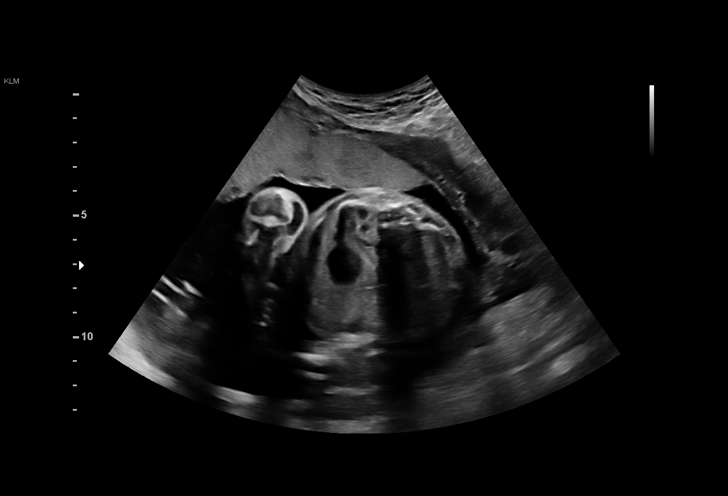
[im 8/39]
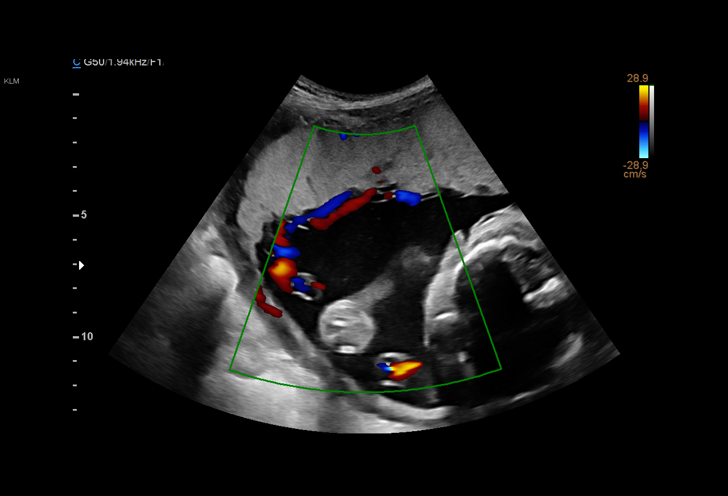
[im 10/39]
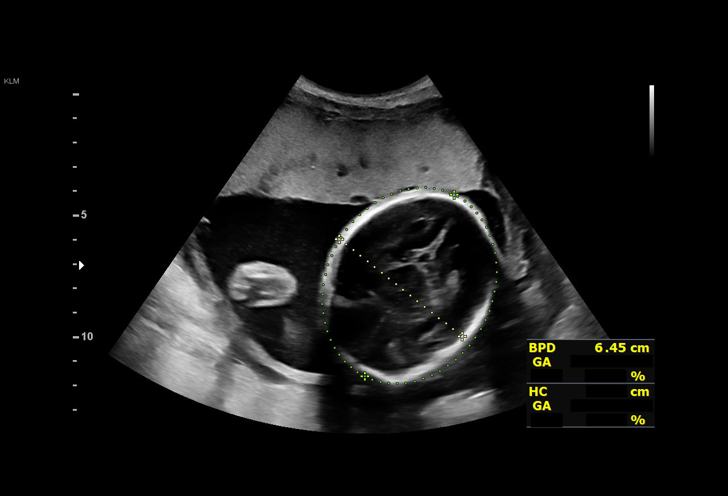
[im 13/39]
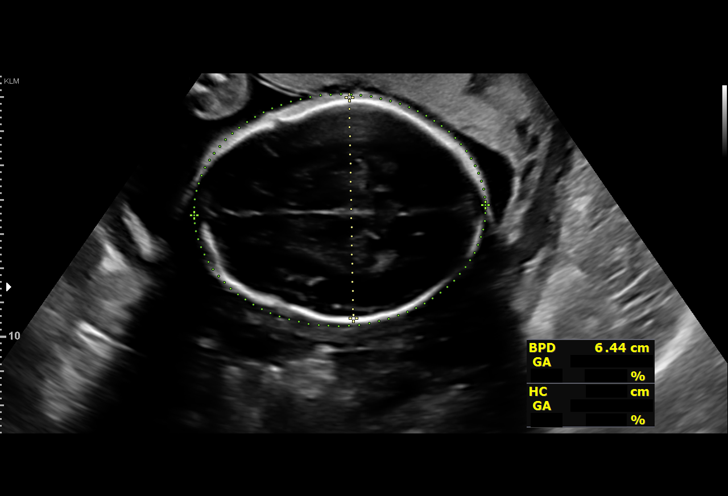
[im 16/39]
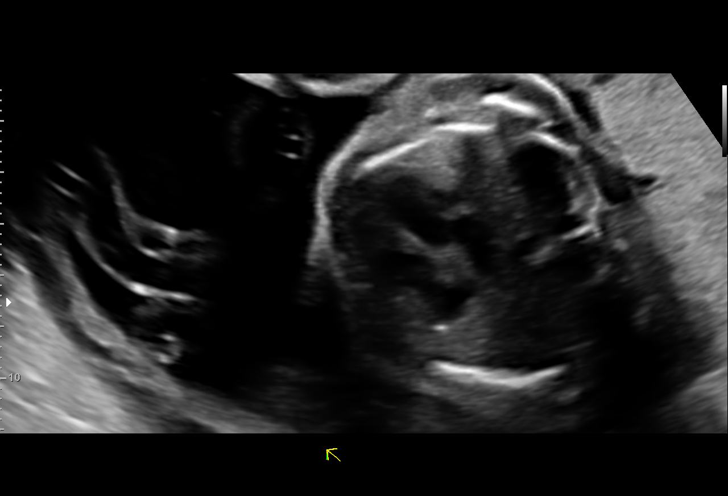
[im 20/39]
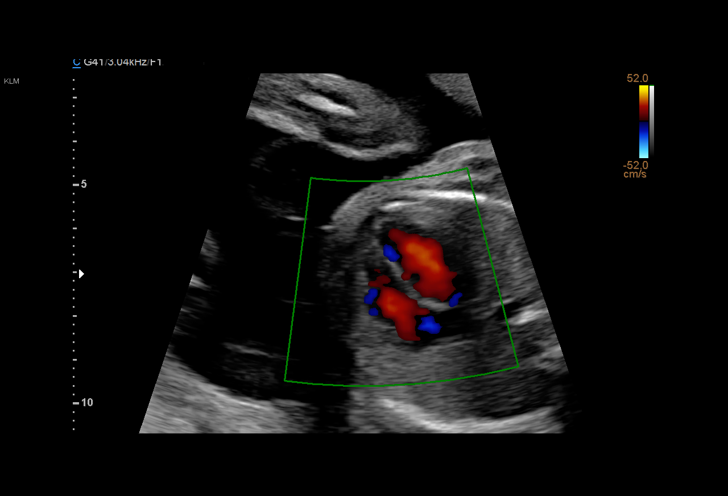
[im 23/39]
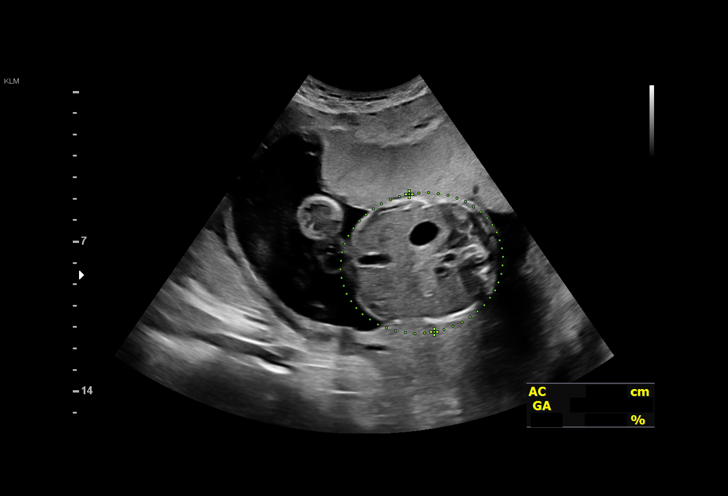
[im 26/39]
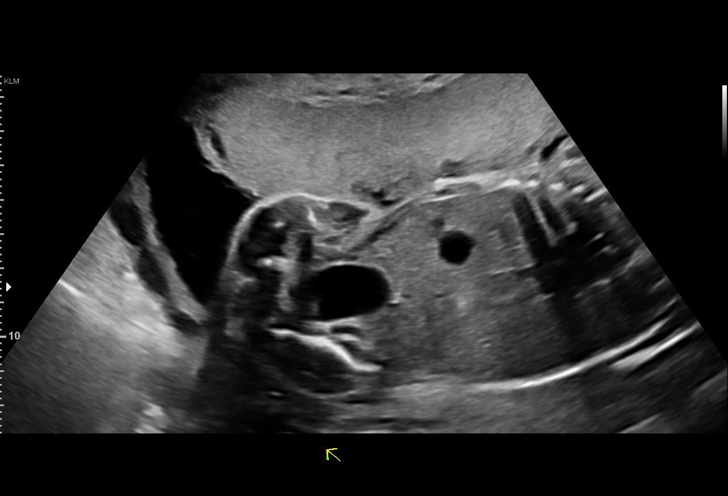
[im 29/39]
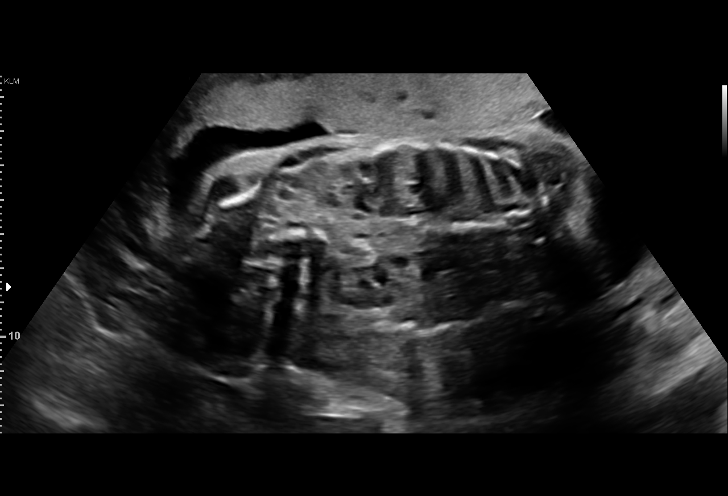
[im 31/39]
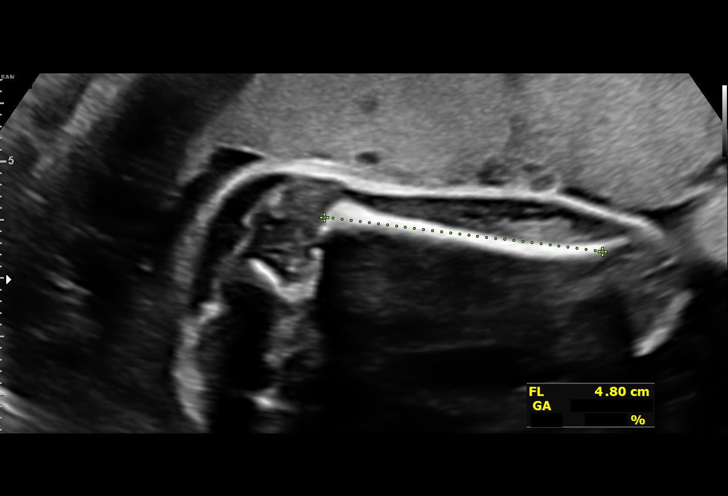
[im 34/39]
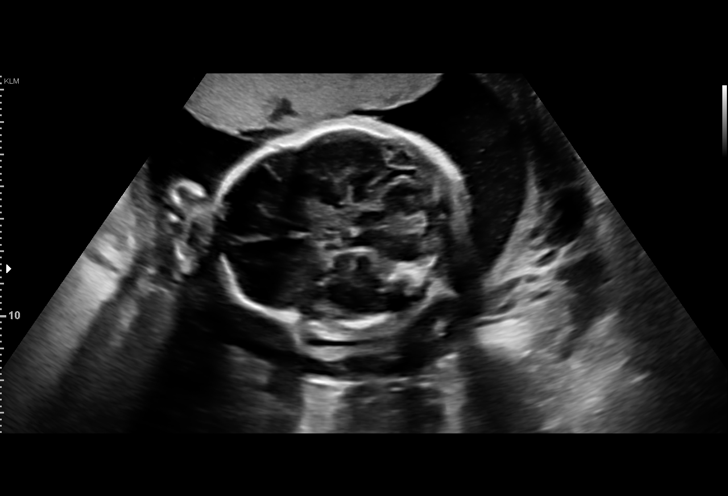
[im 37/39]
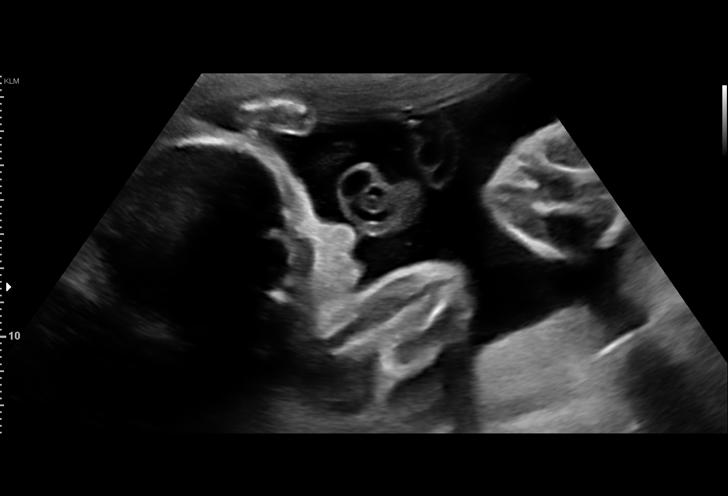

[13 of 28 positions shown; findings below may reference images not displayed]

[id number]

                                                       BRACK TIGER
 ----------------------------------------------------------------------

 ----------------------------------------------------------------------
Indications

  Marginal insertion of umbilical cord affecting
  management of mother in second trimester
  26 weeks gestation of pregnancy
  Family history of genetic disorder (Brother
  has Angelman syndrome)
  Low risk NIPS, abnormal AFP Tetra -
  increased risk for Down synd
 ----------------------------------------------------------------------
Fetal Evaluation

 Num Of Fetuses:         1
 Fetal Heart Rate(bpm):  143
 Cardiac Activity:       Observed
 Presentation:           Transverse, head to maternal left
 Placenta:               Anterior
 P. Cord Insertion:      Visualized

 Amniotic Fluid
 AFI FV:      Within normal limits

                             Largest Pocket(cm)

Biometry

 BPD:      64.5  mm     G. Age:  26w 1d         27  %    CI:        73.95   %    70 - 86
                                                         FL/HC:      20.6   %    18.6 -
 HC:      238.2  mm     G. Age:  25w 6d         11  %    HC/AC:      1.07        1.04 -
 AC:      222.3  mm     G. Age:  26w 5d         49  %    FL/BPD:     76.1   %    71 - 87
 FL:       49.1  mm     G. Age:  26w 4d         38  %    FL/AC:      22.1   %    20 - 24

 Est. FW:     944  gm      2 lb 1 oz     41  %
OB History

 Gravidity:    3         Term:   2        Prem:   0        SAB:   0
 TOP:          0       Ectopic:  0        Living: 2
Gestational Age

 LMP:           26w 3d        Date:  07/09/19                 EDD:   04/14/20
 U/S Today:     26w 2d                                        EDD:   04/15/20
 Best:          26w 3d     Det. By:  LMP  (07/09/19)          EDD:   04/14/20
Anatomy

 Cranium:               Appears normal         LVOT:                   Previously seen
 Cavum:                 Appears normal         Aortic Arch:            Previously seen
 Ventricles:            Previously seen        Ductal Arch:            Previously seen
 Choroid Plexus:        Appears normal         Diaphragm:              Appears normal
 Cerebellum:            Appears normal         Stomach:                Appears normal, left
                                                                       sided
 Posterior Fossa:       Previously seen        Abdomen:                Appears normal
 Nuchal Fold:           Not applicable (>20    Abdominal Wall:         Previously seen
                        wks GA)
 Face:                  Orbits and profile     Cord Vessels:           Previously seen
                        previously seen
 Lips:                  Previously seen        Kidneys:                Appear normal
 Palate:                Previously seen        Bladder:                Appears normal
 Thoracic:              Appears normal         Spine:                  Previously seen
 Heart:                 Appears normal         Upper Extremities:      Previously seen
                        (4CH, axis, and
                        situs)
 RVOT:                  Previously seen        Lower Extremities:      Previously seen

 Other:  Female gender Nasal bone previously visualized.
Cervix Uterus Adnexa

 Cervix
 Not visualized (advanced GA >69wks)
Impression

 Follow up growth s/p normal echocardiogram at [facility] to rule
 out VSD.
 Known abnormal quad screen with low risk NIPS
 Good fetal movement and amnioti fluid.
 Known marginal cord insertion
Recommendations

 Follow up scheduled in 4-6 weeks.

## 2021-01-03 IMAGING — US US MFM OB FOLLOW-UP
1 series · 13 of 28 positions shown · non-contrast
Comparison: none

[Series 1: us mfm ob follow-up · 36 acquisitions, 13 frames shown]
[im 2/36]
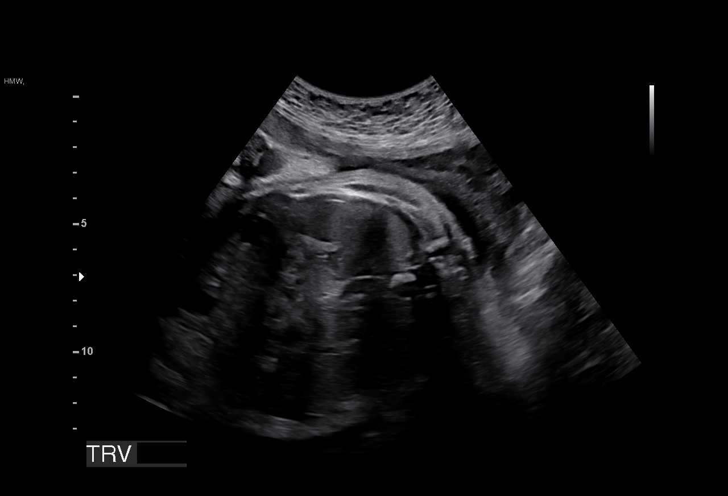
[im 4/36]
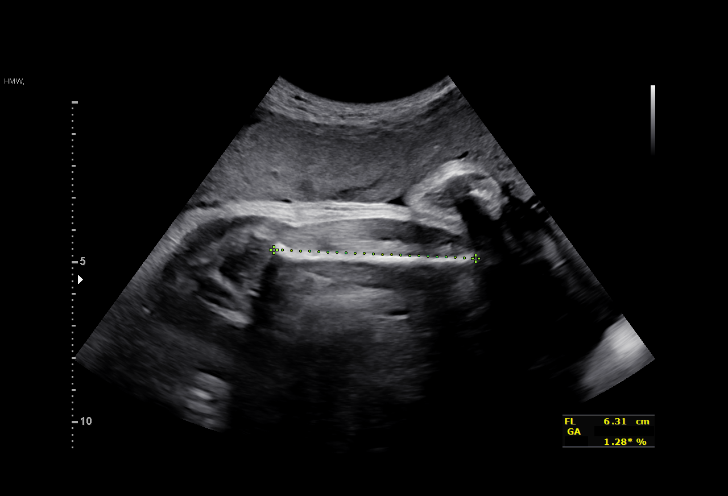
[im 7/36]
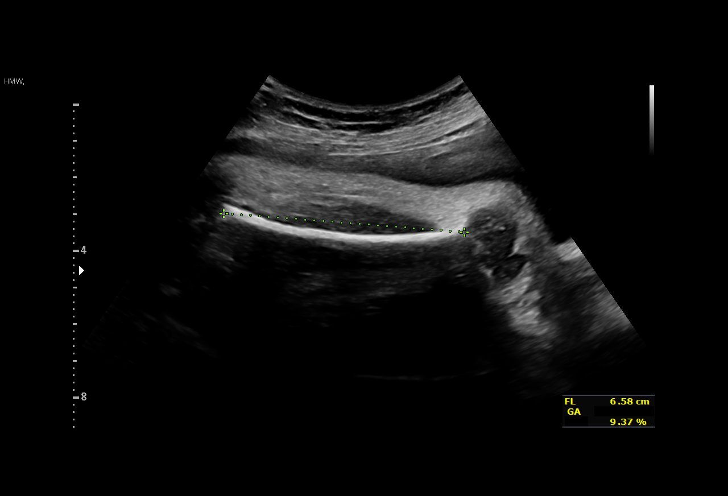
[im 10/36]
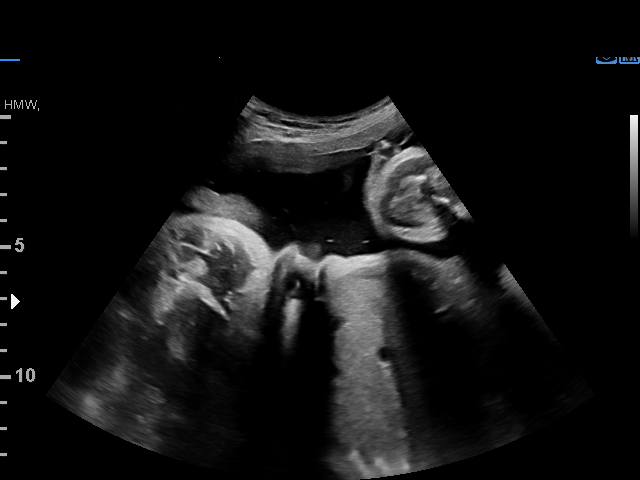
[im 12/36]
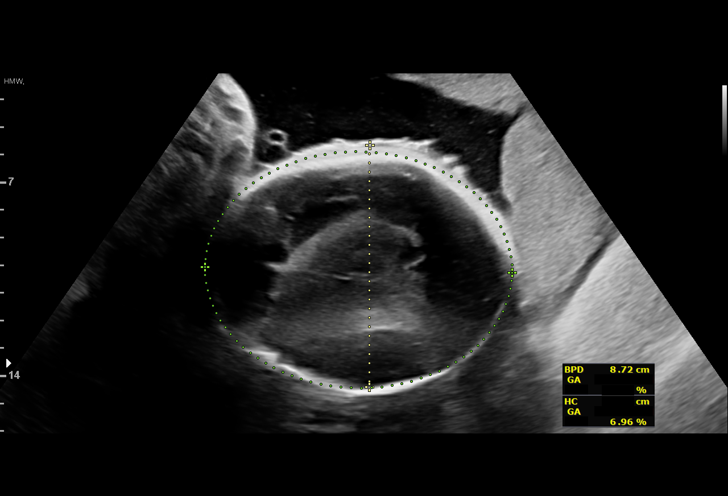
[im 15/36]
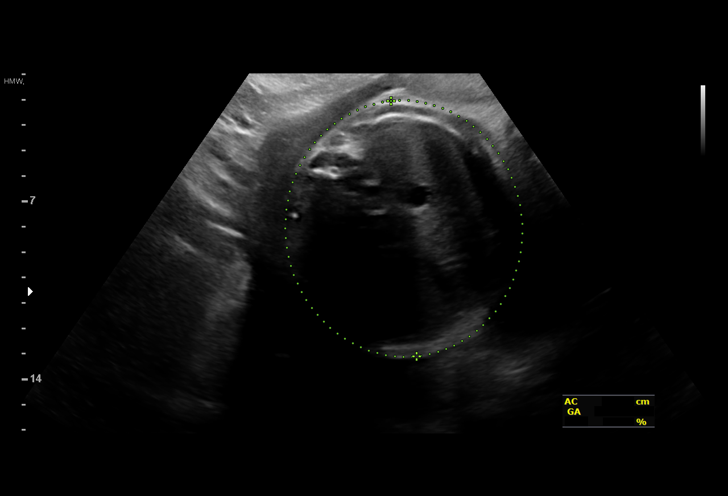
[im 19/36]
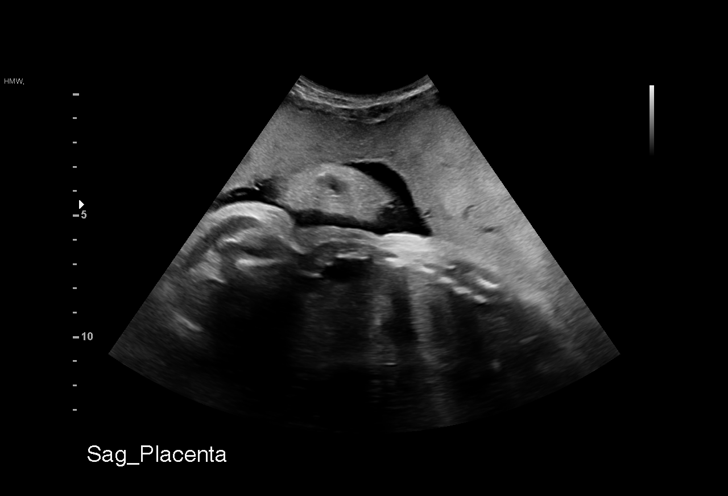
[im 21/36]
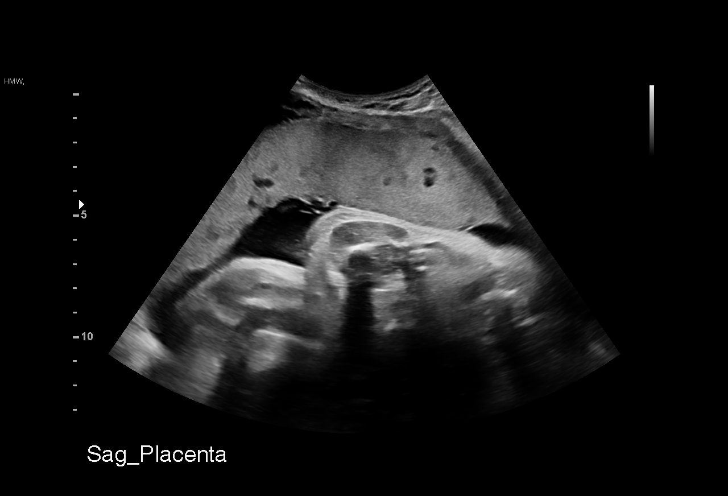
[im 24/36]
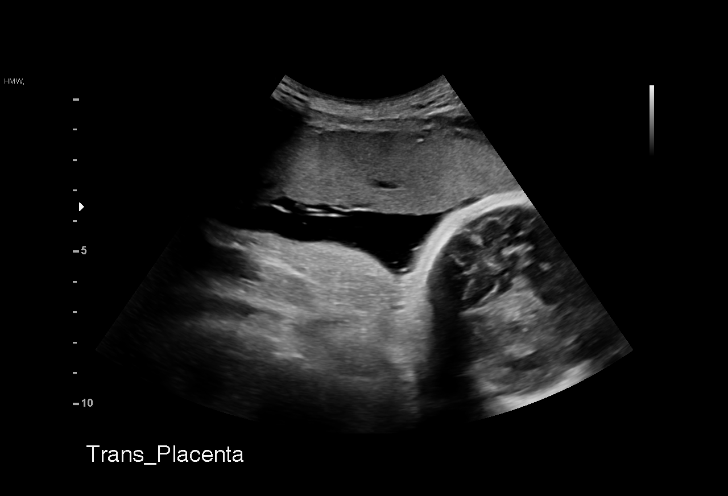
[im 26/36]
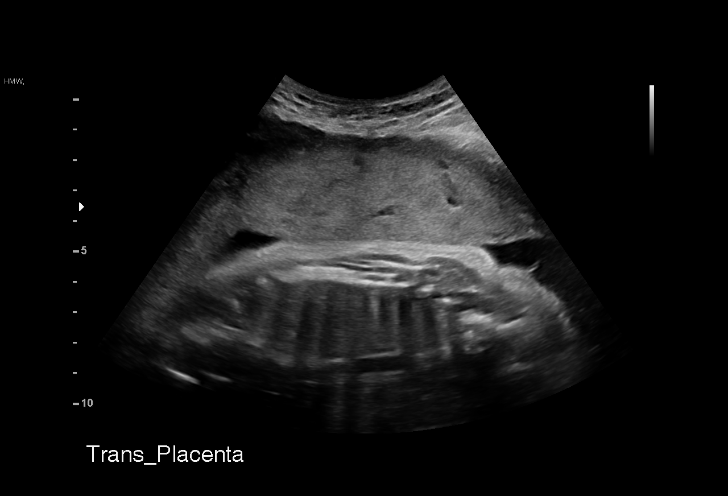
[im 29/36]
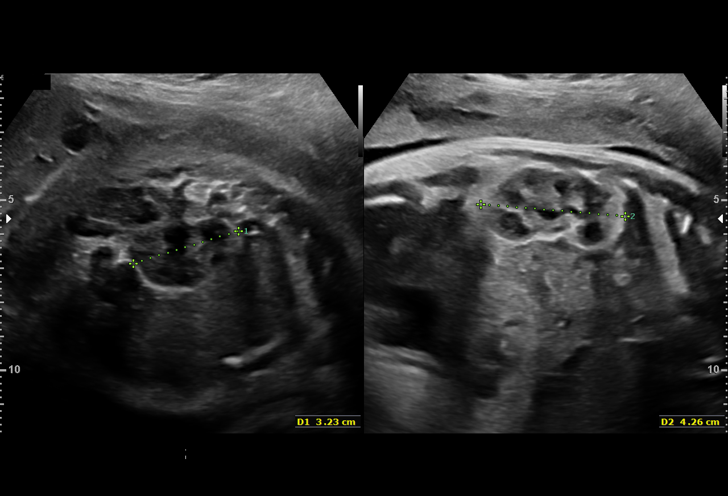
[im 32/36]
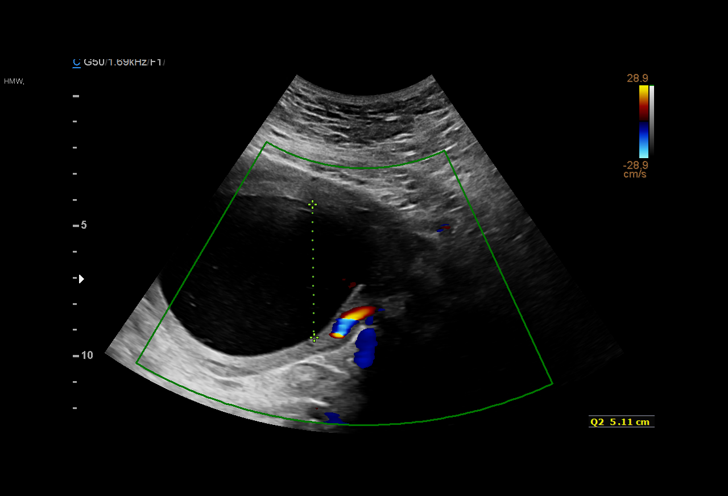
[im 34/36]
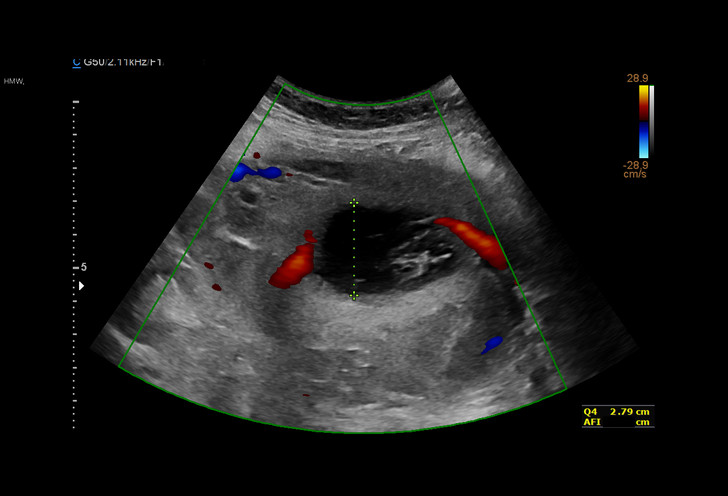

[13 of 28 positions shown; findings below may reference images not displayed]

[id number]

                                                       ESTRELINHA
 ----------------------------------------------------------------------

 ----------------------------------------------------------------------
Indications

  Marginal insertion of umbilical cord affecting
  management of mother in second trimester
  35 weeks gestation of pregnancy
  Family history of genetic disorder (Brother
  has Angelman syndrome)
  Low risk NIPS, abnormal AFP Tetra -
  increased risk for Down synd
  Encounter for other antenatal screening
  follow-up
 ----------------------------------------------------------------------
Fetal Evaluation

 Num Of Fetuses:         1
 Fetal Heart Rate(bpm):  141
 Cardiac Activity:       Observed
 Presentation:           Transverse, head to maternal left
 Placenta:               Anterior
 P. Cord Insertion:      Previously Visualized

 Amniotic Fluid
 AFI FV:      Within normal limits

 AFI Sum(cm)     %Tile       Largest Pocket(cm)
 15.84           58

 RUQ(cm)       RLQ(cm)       LUQ(cm)        LLQ(cm)

Biometry

 BPD:      85.1  mm     G. Age:  34w 2d         20  %    CI:        77.01   %    70 - 86
                                                         FL/HC:      21.8   %    20.1 -
 HC:      307.1  mm     G. Age:  34w 2d          3  %    HC/AC:      0.97        0.93 -
 AC:      317.3  mm     G. Age:  35w 5d         61  %    FL/BPD:     78.8   %    71 - 87
 FL:       67.1  mm     G. Age:  34w 4d         19  %    FL/AC:      21.1   %    20 - 24

 Est. FW:    2482  gm    5 lb 11 oz      34  %
OB History

 Gravidity:    3         Term:   2        Prem:   0        SAB:   0
 TOP:          0       Ectopic:  0        Living: 2
Gestational Age

 LMP:           35w 4d        Date:  07/09/19                 EDD:   04/14/20
 U/S Today:     34w 5d                                        EDD:   04/20/20
 Best:          35w 4d     Det. By:  LMP  (07/09/19)          EDD:   04/14/20
Anatomy

 Cranium:               Appears normal         LVOT:                   Previously seen
 Cavum:                 Previously seen        Aortic Arch:            Previously seen
 Ventricles:            Appears normal         Ductal Arch:            Previously seen
 Choroid Plexus:        Previously seen        Diaphragm:              Previously seen
 Cerebellum:            Previously seen        Stomach:                Appears normal, left
                                                                       sided
 Posterior Fossa:       Previously seen        Abdomen:                Previously seen
 Nuchal Fold:           Not applicable (>20    Abdominal Wall:         Previously seen
                        wks GA)
 Face:                  Orbits and profile     Cord Vessels:           Previously seen
                        previously seen
 Lips:                  Previously seen        Kidneys:                Appear normal
 Palate:                Previously seen        Bladder:                Appears normal
 Thoracic:              Appears normal         Spine:                  Previously seen
 Heart:                 Previously seen        Upper Extremities:      Previously seen
 RVOT:                  Previously seen        Lower Extremities:      Previously seen

 Other:  Female gender. Nasal bone previously visualized.
Cervix Uterus Adnexa

 Cervix
 Not visualized (advanced GA >81wks)

 Uterus
 No abnormality visualized.

 Left Ovary
 No adnexal mass visualized.

 Right Ovary
 No adnexal mass visualized.

 Cul De Sac
 No free fluid seen.
 Adnexa
 No abnormality visualized.
Comments

 This patient was seen for a follow up growth scan due to a
 marginal placental cord insertion that was noted during her
 earlier ultrasound exams.  She denies any problems since
 her last exam.
 She was informed that the fetal growth and amniotic fluid
 level appears appropriate for her gestational age.
 Follow-up as indicated.
